# Patient Record
Sex: Female | Born: 1955
Health system: Southern US, Community
[De-identification: ages and names within clinical notes are randomized; demographics above are authoritative.]

## PROBLEM LIST (undated history)

## (undated) DIAGNOSIS — I10 Essential (primary) hypertension: Secondary | ICD-10-CM

---

## 2003-05-06 DIAGNOSIS — I10 Essential (primary) hypertension: Secondary | ICD-10-CM | POA: Insufficient documentation

## 2003-05-06 DIAGNOSIS — E669 Obesity, unspecified: Secondary | ICD-10-CM | POA: Insufficient documentation

## 2006-03-21 DIAGNOSIS — N915 Oligomenorrhea, unspecified: Secondary | ICD-10-CM | POA: Insufficient documentation

## 2006-11-19 ENCOUNTER — Ambulatory Visit: Payer: Self-pay | Admitting: Cardiology

## 2006-12-17 ENCOUNTER — Ambulatory Visit: Payer: Self-pay | Admitting: Cardiology

## 2008-03-16 ENCOUNTER — Ambulatory Visit: Payer: Self-pay | Admitting: Cardiology

## 2008-03-16 DIAGNOSIS — F419 Anxiety disorder, unspecified: Secondary | ICD-10-CM | POA: Insufficient documentation

## 2008-03-16 DIAGNOSIS — I169 Hypertensive crisis, unspecified: Secondary | ICD-10-CM | POA: Insufficient documentation

## 2010-05-23 NOTE — Assessment & Plan Note (Signed)
[facility]                          [facility] OFFICE NOTE   DAN, DISSINGER                       MRN:          [id number]  DATE:12/17/2006                            DOB:          October 05, 1955    HISTORY OF PRESENT ILLNESS:  The patient is a 55 year old female with a  history of obesity and hypertension. The patient was recently admitted  and ruled out for myocardial infarction. The patient underwent a  Cardiolite study, which was low risk. She also had an echo done which  was within normal limits. Ultimately, the patient was felt to be stable  and arrangements were made to followup in our office to see if she would  need any further workup. She did report a single episode of chest pain  for which she takes nitroglycerin. However, she has had no further  complaint since. She states that she is able to her daily activities  without any exertional chest pain or shortness of breath.   MEDICATIONS:  1. Lexapro 10 mg p.o. daily.  2. Hydrochlorothiazide 25 mg p.o. daily.  3. Metoprolol 25 mg p.o. daily.   PHYSICAL EXAMINATION:  VITAL SIGNS: Blood pressure is 144/84, heart rate  76 beats per minute.  NECK: Normal carotid upstroke. No carotid bruits.  LUNGS:  Clear breath sounds bilaterally.  HEART: Regular rate and rhythm.  ABDOMEN: Soft.  EXTREMITIES: No cyanosis, clubbing or edema.  NEURO: The patient is alert, oriented and grossly nonfocal.   PROBLEM LIST:  1. Atypical chest pain.  2. Low risk Cardiolite study.  3. Rule out for myocardial infarction.   PLAN:  1. The patient remains at low risk. She does not smoke and has no      recurrent substernal chest pain. I feel we can continue to watch      her and continue her medical therapy.  2. I have advised the patient that if she has recurrent chest pain, we      can certainly consider cardiac catheterization at that time.     Learta Codding, MD,FACC  Electronically Signed   GED/MedQ  DD: 12/17/2006  DT: 12/17/2006  Job #: [id number]   cc:   Hospitalist Service

## 2010-09-12 DIAGNOSIS — M21611 Bunion of right foot: Secondary | ICD-10-CM | POA: Insufficient documentation

## 2012-07-08 DIAGNOSIS — G8929 Other chronic pain: Secondary | ICD-10-CM | POA: Insufficient documentation

## 2012-07-08 DIAGNOSIS — L84 Corns and callosities: Secondary | ICD-10-CM | POA: Insufficient documentation

## 2015-09-18 ENCOUNTER — Emergency Department (HOSPITAL_COMMUNITY): Payer: Medicare Other

## 2015-09-18 ENCOUNTER — Encounter (HOSPITAL_COMMUNITY): Payer: Self-pay | Admitting: Emergency Medicine

## 2015-09-18 ENCOUNTER — Emergency Department (HOSPITAL_COMMUNITY)
Admission: EM | Admit: 2015-09-18 | Discharge: 2015-09-18 | Disposition: A | Discharge status: Home / Self Care | Payer: Medicare Other | Attending: Emergency Medicine | Admitting: Emergency Medicine

## 2015-09-18 DIAGNOSIS — Z79899 Other long term (current) drug therapy: Secondary | ICD-10-CM | POA: Insufficient documentation

## 2015-09-18 DIAGNOSIS — I10 Essential (primary) hypertension: Secondary | ICD-10-CM | POA: Diagnosis not present

## 2015-09-18 DIAGNOSIS — Z7982 Long term (current) use of aspirin: Secondary | ICD-10-CM | POA: Insufficient documentation

## 2015-09-18 DIAGNOSIS — R0602 Shortness of breath: Secondary | ICD-10-CM | POA: Insufficient documentation

## 2015-09-18 DIAGNOSIS — R079 Chest pain, unspecified: Secondary | ICD-10-CM | POA: Insufficient documentation

## 2015-09-18 DIAGNOSIS — R112 Nausea with vomiting, unspecified: Secondary | ICD-10-CM | POA: Insufficient documentation

## 2015-09-18 HISTORY — DX: Essential (primary) hypertension: I10

## 2015-09-18 LAB — COMPREHENSIVE METABOLIC PANEL
ALT: 14 U/L (ref 14–54)
ANION GAP: 9 (ref 5–15)
AST: 18 U/L (ref 15–41)
Albumin: 4 g/dL (ref 3.5–5.0)
Alkaline Phosphatase: 102 U/L (ref 38–126)
BUN: 14 mg/dL (ref 6–20)
CHLORIDE: 104 mmol/L (ref 101–111)
CO2: 23 mmol/L (ref 22–32)
Calcium: 9.6 mg/dL (ref 8.9–10.3)
Creatinine, Ser: 0.6 mg/dL (ref 0.44–1.00)
GFR calc non Af Amer: >60 mL/min (ref >60)
Glucose, Bld: 104 mg/dL — ABNORMAL HIGH (ref 65–99)
Potassium: 3.5 mmol/L (ref 3.5–5.1)
SODIUM: 136 mmol/L (ref 135–145)
Total Bilirubin: 1.3 mg/dL — ABNORMAL HIGH (ref 0.3–1.2)
Total Protein: 7.4 g/dL (ref 6.5–8.1)

## 2015-09-18 LAB — CBC WITH DIFFERENTIAL/PLATELET
Basophils Absolute: 0 10*3/uL (ref 0.0–0.1)
Basophils Relative: 0 %
EOS ABS: 0.3 10*3/uL (ref 0.0–0.7)
EOS PCT: 4 %
HCT: 40.2 % (ref 36.0–46.0)
Hemoglobin: 13.3 g/dL (ref 12.0–15.0)
LYMPHS ABS: 1.6 10*3/uL (ref 0.7–4.0)
Lymphocytes Relative: 23 %
MCH: 29.1 pg (ref 26.0–34.0)
MCHC: 33.1 g/dL (ref 30.0–36.0)
MCV: 88 fL (ref 78.0–100.0)
MONOS PCT: 8 %
Monocytes Absolute: 0.6 10*3/uL (ref 0.1–1.0)
Neutro Abs: 4.5 10*3/uL (ref 1.7–7.7)
Neutrophils Relative %: 65 %
PLATELETS: 201 10*3/uL (ref 150–400)
RBC: 4.57 MIL/uL (ref 3.87–5.11)
RDW: 12.9 % (ref 11.5–15.5)
WBC: 7 10*3/uL (ref 4.0–10.5)

## 2015-09-18 LAB — I-STAT TROPONIN, ED: Troponin i, poc: 0 ng/mL (ref 0.00–0.08)

## 2015-09-18 LAB — D-DIMER, QUANTITATIVE (NOT AT ARMC): D DIMER QUANT: 0.49 ug{FEU}/mL (ref 0.00–0.50)

## 2015-09-18 LAB — LIPASE, BLOOD: Lipase: 19 U/L (ref 11–51)

## 2015-09-18 MED ORDER — SODIUM CHLORIDE 0.9 % IV SOLN
INTRAVENOUS | Status: DC
  Administered 2015-09-18: 100 mL/h via INTRAVENOUS

## 2015-09-18 MED ORDER — ASPIRIN 81 MG PO CHEW: 324.0000 mg | CHEWABLE_TABLET | Freq: Once | ORAL | Status: DC

## 2015-09-18 MED ORDER — ONDANSETRON HCL 4 MG/2ML IJ SOLN
4.0000 mg | Freq: Once | INTRAMUSCULAR | Status: AC
  Administered 2015-09-18: 4 mg via INTRAVENOUS
  Filled 2015-09-18: qty 2

## 2015-09-18 NOTE — ED Notes (Addendum)
Pt's POA  Roylene Reason   [phone]

## 2015-09-18 NOTE — ED Triage Notes (Addendum)
Pt reports chest pain beginning a few days ago, intermittent in nature.  States she has been very worried about her mother as her mother takes care of her and was recently in the hospital.  States these episodes are associated with feeling like she cannot get a deep breath and resolve "not long after they start."  Has also been crying a lot.

## 2015-09-18 NOTE — ED Notes (Signed)
Patient verbalizes understanding of discharge instructions, home care and follow up care. Patient out of department at this time. 

## 2015-09-18 NOTE — ED Provider Notes (Signed)
[facility] DEPT Provider Note   CSN: [id number] Arrival date & time: 09/18/15  1522     History   Chief Complaint Chief Complaint  Patient presents with  . Chest Pain    HPI Crystal Kelly is a 60 y.o. female.  Patient with onset of left-sided chest pain at 2 in the morning yesterday. Pain has been constant. Radiates occasionally to the left arm. Associated with shortness of breath and 2 episodes of vomiting yesterday. No vomiting today. No prior history of similar chest pain. Pain at its worse was described as 8 out of 10. Currently it's improving in its about 2 out of 10.      Past Medical History:  Diagnosis Date  . Hypertension     There are no active problems to display for this patient.   History reviewed. No pertinent surgical history.  OB History    No data available       Home Medications    Prior to Admission medications   Medication Sig Start Date End Date Taking? Authorizing Provider  acetaminophen (TYLENOL) 500 MG tablet Take 1,000 mg by mouth every 6 (six) hours as needed for mild pain or headache.   Yes Historical Provider, MD  amLODipine (NORVASC) 5 MG tablet Take 5 mg by mouth daily.   Yes Historical Provider, MD  aspirin EC 81 MG tablet Take 81 mg by mouth daily.   Yes Historical Provider, MD  Multiple Vitamins-Minerals (EQ VISION FORMULA 50+ PO) Take 1 tablet by mouth daily.   Yes Historical Provider, MD    Family History History reviewed. No pertinent family history.  Social History Social History  Substance Use Topics  . Smoking status: Never Smoker  . Smokeless tobacco: Not on file  . Alcohol use No     Allergies   Penicillins   Review of Systems Review of Systems  Constitutional: Negative for fever.  HENT: Positive for congestion.   Eyes: Negative for visual disturbance.  Respiratory: Positive for shortness of breath.   Cardiovascular: Positive for chest pain. Negative for leg swelling.  Gastrointestinal: Positive for  nausea and vomiting. Negative for abdominal pain.  Genitourinary: Negative for dysuria.  Musculoskeletal: Negative for back pain.  Skin: Negative for rash.  Neurological: Negative for headaches.  Hematological: Does not bruise/bleed easily.  Psychiatric/Behavioral: Negative for confusion.     Physical Exam Updated Vital Signs BP 121/59   Pulse 71   Temp 98 F (36.7 C) (Oral)   Resp 21   Ht 5\' 6"  (1.676 m)   Wt 104.3 kg   SpO2 96%   BMI 37.12 kg/m   Physical Exam  Constitutional: She is oriented to person, place, and time. She appears well-developed and well-nourished. No distress.  HENT:  Head: Normocephalic and atraumatic.  Eyes: EOM are normal. Pupils are equal, round, and reactive to light.  Neck: Normal range of motion. Neck supple.  Cardiovascular: Normal rate, regular rhythm and normal heart sounds.   Pulmonary/Chest: Effort normal and breath sounds normal. No respiratory distress.  Abdominal: Soft. Bowel sounds are normal. There is no tenderness.  Musculoskeletal: Normal range of motion. She exhibits no edema.  Neurological: She is alert and oriented to person, place, and time. No cranial nerve deficit. She exhibits normal muscle tone. Coordination normal.  Skin: Skin is warm.  Nursing note and vitals reviewed.    ED Treatments / Results  Labs (all labs ordered are listed, but only abnormal results are displayed) Labs Reviewed  COMPREHENSIVE METABOLIC PANEL -  Abnormal; Notable for the following:       Result Value   Glucose, Bld 104 (*)    Total Bilirubin 1.3 (*)    All other components within normal limits  CBC WITH DIFFERENTIAL/PLATELET  LIPASE, BLOOD  D-DIMER, QUANTITATIVE (NOT AT [facility])  I-STAT TROPOININ, ED    EKG  EKG Interpretation  Date/Time:  Sunday September 18 2015 15:25:31 EDT Ventricular Rate:  82 PR Interval:    QRS Duration: 109 QT Interval:  388 QTC Calculation: 454 R Axis:   -26 Text Interpretation:  Sinus rhythm Borderline  prolonged PR interval Left ventricular hypertrophy No previous ECGs available Confirmed by Joley Utecht  MD, Laiza Veenstra [phone]) on 09/18/2015 3:33:12 PM       Radiology Dg Chest 2 View  Result Date: 09/18/2015 CLINICAL DATA:  Chest pain EXAM: CHEST  2 VIEW COMPARISON:  None. FINDINGS: Cardiomediastinal silhouette is unremarkable. No acute infiltrate or pleural effusion. No pulmonary edema. Osteopenia and mild degenerative changes thoracic spine. IMPRESSION: No active cardiopulmonary disease. Electronically Signed   By: Lahoma Crocker M.D.   On: 09/18/2015 17:06    Procedures Procedures (including critical care time)  Medications Ordered in ED Medications  0.9 %  sodium chloride infusion (100 mL/hr Intravenous New Bag/Given 09/18/15 1736)  aspirin chewable tablet 324 mg (324 mg Oral Not Given 09/18/15 1731)  ondansetron (ZOFRAN) injection 4 mg (4 mg Intravenous Given 09/18/15 1736)     Initial Impression / Assessment and Plan / ED Course  I have reviewed the triage vital signs and the nursing notes.  Pertinent labs & imaging results that were available during my care of the patient were reviewed by me and considered in my medical decision making (see chart for details).  Clinical Course    Patient with a complaint of chest pain that started on Saturday afternoon yesterday at 2 in the morning. Spin constant left-sided chest pain involving left arm. Associated with a couple episodes of vomiting and shortness of breath. Pain is been improving today not getting worse but has not resolved. After stay here in the emergency department pain completely resolved. Workup without any acute findings. Troponin was negative d-dimer was negative chest x-ray without evidence of pneumonia pneumothorax or pulmonary edema. Patient stable for discharge home and follow-up with cardiology. Patient was started on baby aspirin a day.  Final Clinical Impressions(s) / ED Diagnoses   Final diagnoses:  Chest pain, unspecified  chest pain type    New Prescriptions New Prescriptions   No medications on file     [name], MD 09/18/15 1908

## 2015-09-18 NOTE — ED Provider Notes (Signed)
[facility] DEPT Provider Note   CSN: [id number] Arrival date & time: 09/18/15  1522     History   Chief Complaint Chief Complaint  Patient presents with  . Chest Pain    HPI Crystal Kelly is a 60 y.o. female.  HPI  Past Medical History:  Diagnosis Date  . Hypertension     There are no active problems to display for this patient.   History reviewed. No pertinent surgical history.  OB History    No data available       Home Medications    Prior to Admission medications   Medication Sig Start Date End Date Taking? Authorizing Provider  acetaminophen (TYLENOL) 500 MG tablet Take 1,000 mg by mouth every 6 (six) hours as needed for mild pain or headache.   Yes Historical Provider, MD  amLODipine (NORVASC) 5 MG tablet Take 5 mg by mouth daily.   Yes Historical Provider, MD  aspirin EC 81 MG tablet Take 81 mg by mouth daily.   Yes Historical Provider, MD  Multiple Vitamins-Minerals (EQ VISION FORMULA 50+ PO) Take 1 tablet by mouth daily.   Yes Historical Provider, MD    Family History History reviewed. No pertinent family history.  Social History Social History  Substance Use Topics  . Smoking status: Never Smoker  . Smokeless tobacco: Not on file  . Alcohol use No     Allergies   Penicillins   Review of Systems Review of Systems   Physical Exam Updated Vital Signs BP 121/59   Pulse 71   Temp 98 F (36.7 C) (Oral)   Resp 21   Ht 5\' 6"  (1.676 m)   Wt 104.3 kg   SpO2 96%   BMI 37.12 kg/m   Physical Exam   ED Treatments / Results  Labs (all labs ordered are listed, but only abnormal results are displayed) Labs Reviewed  COMPREHENSIVE METABOLIC PANEL - Abnormal; Notable for the following:       Result Value   Glucose, Bld 104 (*)    Total Bilirubin 1.3 (*)    All other components within normal limits  CBC WITH DIFFERENTIAL/PLATELET  LIPASE, BLOOD  D-DIMER, QUANTITATIVE (NOT AT [facility])  I-STAT TROPOININ, ED    EKG  EKG  Interpretation  Date/Time:  Sunday September 18 2015 15:25:31 EDT Ventricular Rate:  82 PR Interval:    QRS Duration: 109 QT Interval:  388 QTC Calculation: 454 R Axis:   -26 Text Interpretation:  Sinus rhythm Borderline prolonged PR interval Left ventricular hypertrophy No previous ECGs available Confirmed by Euna Armon  MD, Aysel Gilchrest ([id number]) on 09/18/2015 3:33:12 PM       Radiology Dg Chest 2 View  Result Date: 09/18/2015 CLINICAL DATA:  Chest pain EXAM: CHEST  2 VIEW COMPARISON:  None. FINDINGS: Cardiomediastinal silhouette is unremarkable. No acute infiltrate or pleural effusion. No pulmonary edema. Osteopenia and mild degenerative changes thoracic spine. IMPRESSION: No active cardiopulmonary disease. Electronically Signed   By: Lahoma Crocker M.D.   On: 09/18/2015 17:06    Procedures Procedures (including critical care time)  Medications Ordered in ED Medications  0.9 %  sodium chloride infusion (100 mL/hr Intravenous New Bag/Given 09/18/15 1736)  aspirin chewable tablet 324 mg (324 mg Oral Not Given 09/18/15 1731)  ondansetron (ZOFRAN) injection 4 mg (4 mg Intravenous Given 09/18/15 1736)     Initial Impression / Assessment and Plan / ED Course  I have reviewed the triage vital signs and the nursing notes.  Pertinent labs & imaging  results that were available during my care of the patient were reviewed by me and considered in my medical decision making (see chart for details).  Clinical Course      Final Clinical Impressions(s) / ED Diagnoses   Final diagnoses:  Chest pain, unspecified chest pain type    New Prescriptions New Prescriptions   No medications on file     [name], MD 09/18/15 1905

## 2015-09-18 NOTE — Discharge Instructions (Signed)
Make appointment to follow-up with your doctor or cardiology. Recommend taking a baby aspirin a day. Return for any new or worse symptoms.

## 2016-02-23 DIAGNOSIS — M25572 Pain in left ankle and joints of left foot: Secondary | ICD-10-CM | POA: Diagnosis not present

## 2016-02-23 DIAGNOSIS — M25571 Pain in right ankle and joints of right foot: Secondary | ICD-10-CM | POA: Diagnosis not present

## 2016-02-23 DIAGNOSIS — M79672 Pain in left foot: Secondary | ICD-10-CM | POA: Diagnosis not present

## 2016-02-23 DIAGNOSIS — M79671 Pain in right foot: Secondary | ICD-10-CM | POA: Diagnosis not present

## 2016-03-08 DIAGNOSIS — M2011 Hallux valgus (acquired), right foot: Secondary | ICD-10-CM | POA: Diagnosis not present

## 2016-03-08 DIAGNOSIS — Z01812 Encounter for preprocedural laboratory examination: Secondary | ICD-10-CM | POA: Diagnosis not present

## 2016-03-08 DIAGNOSIS — Z79899 Other long term (current) drug therapy: Secondary | ICD-10-CM | POA: Diagnosis not present

## 2016-03-08 DIAGNOSIS — M25571 Pain in right ankle and joints of right foot: Secondary | ICD-10-CM | POA: Diagnosis not present

## 2016-03-20 DIAGNOSIS — M2011 Hallux valgus (acquired), right foot: Secondary | ICD-10-CM | POA: Diagnosis not present

## 2016-03-20 DIAGNOSIS — M2021 Hallux rigidus, right foot: Secondary | ICD-10-CM | POA: Diagnosis not present

## 2016-03-20 DIAGNOSIS — M25571 Pain in right ankle and joints of right foot: Secondary | ICD-10-CM | POA: Diagnosis not present

## 2016-03-20 DIAGNOSIS — M7741 Metatarsalgia, right foot: Secondary | ICD-10-CM | POA: Diagnosis not present

## 2016-03-23 DIAGNOSIS — M25571 Pain in right ankle and joints of right foot: Secondary | ICD-10-CM | POA: Diagnosis not present

## 2016-04-04 DIAGNOSIS — M25571 Pain in right ankle and joints of right foot: Secondary | ICD-10-CM | POA: Diagnosis not present

## 2016-04-19 DIAGNOSIS — M25571 Pain in right ankle and joints of right foot: Secondary | ICD-10-CM | POA: Diagnosis not present

## 2016-05-04 DIAGNOSIS — M25571 Pain in right ankle and joints of right foot: Secondary | ICD-10-CM | POA: Diagnosis not present

## 2016-06-01 DIAGNOSIS — M25571 Pain in right ankle and joints of right foot: Secondary | ICD-10-CM | POA: Diagnosis not present

## 2016-08-07 DIAGNOSIS — Z789 Other specified health status: Secondary | ICD-10-CM | POA: Diagnosis not present

## 2016-08-07 DIAGNOSIS — M21969 Unspecified acquired deformity of unspecified lower leg: Secondary | ICD-10-CM | POA: Diagnosis not present

## 2016-08-07 DIAGNOSIS — Z299 Encounter for prophylactic measures, unspecified: Secondary | ICD-10-CM | POA: Diagnosis not present

## 2016-08-07 DIAGNOSIS — I10 Essential (primary) hypertension: Secondary | ICD-10-CM | POA: Diagnosis not present

## 2016-08-07 DIAGNOSIS — Z6841 Body Mass Index (BMI) 40.0 and over, adult: Secondary | ICD-10-CM | POA: Diagnosis not present

## 2016-08-07 DIAGNOSIS — M170 Bilateral primary osteoarthritis of knee: Secondary | ICD-10-CM | POA: Diagnosis not present

## 2016-08-20 ENCOUNTER — Other Ambulatory Visit: Payer: Self-pay | Admitting: Otolaryngology

## 2017-06-25 DIAGNOSIS — Z713 Dietary counseling and surveillance: Secondary | ICD-10-CM | POA: Diagnosis not present

## 2017-06-25 DIAGNOSIS — I10 Essential (primary) hypertension: Secondary | ICD-10-CM | POA: Diagnosis not present

## 2017-06-25 DIAGNOSIS — Z6841 Body Mass Index (BMI) 40.0 and over, adult: Secondary | ICD-10-CM | POA: Diagnosis not present

## 2017-06-25 DIAGNOSIS — Z299 Encounter for prophylactic measures, unspecified: Secondary | ICD-10-CM | POA: Diagnosis not present

## 2018-03-19 DIAGNOSIS — Z1331 Encounter for screening for depression: Secondary | ICD-10-CM | POA: Diagnosis not present

## 2018-03-19 DIAGNOSIS — I10 Essential (primary) hypertension: Secondary | ICD-10-CM | POA: Diagnosis not present

## 2018-03-19 DIAGNOSIS — Z2821 Immunization not carried out because of patient refusal: Secondary | ICD-10-CM | POA: Diagnosis not present

## 2018-03-19 DIAGNOSIS — Z7189 Other specified counseling: Secondary | ICD-10-CM | POA: Diagnosis not present

## 2018-03-19 DIAGNOSIS — Z1211 Encounter for screening for malignant neoplasm of colon: Secondary | ICD-10-CM | POA: Diagnosis not present

## 2018-03-19 DIAGNOSIS — Z6841 Body Mass Index (BMI) 40.0 and over, adult: Secondary | ICD-10-CM | POA: Diagnosis not present

## 2018-03-19 DIAGNOSIS — Z1339 Encounter for screening examination for other mental health and behavioral disorders: Secondary | ICD-10-CM | POA: Diagnosis not present

## 2018-03-19 DIAGNOSIS — Z0000 Encounter for general adult medical examination without abnormal findings: Secondary | ICD-10-CM | POA: Diagnosis not present

## 2018-03-19 DIAGNOSIS — Z299 Encounter for prophylactic measures, unspecified: Secondary | ICD-10-CM | POA: Diagnosis not present

## 2018-03-25 DIAGNOSIS — Z79899 Other long term (current) drug therapy: Secondary | ICD-10-CM | POA: Diagnosis not present

## 2018-03-25 DIAGNOSIS — I10 Essential (primary) hypertension: Secondary | ICD-10-CM | POA: Diagnosis not present

## 2018-06-25 DIAGNOSIS — D485 Neoplasm of uncertain behavior of skin: Secondary | ICD-10-CM | POA: Diagnosis not present

## 2018-06-25 DIAGNOSIS — I10 Essential (primary) hypertension: Secondary | ICD-10-CM | POA: Diagnosis not present

## 2018-06-25 DIAGNOSIS — D3610 Benign neoplasm of peripheral nerves and autonomic nervous system, unspecified: Secondary | ICD-10-CM | POA: Diagnosis not present

## 2018-06-25 DIAGNOSIS — Z6841 Body Mass Index (BMI) 40.0 and over, adult: Secondary | ICD-10-CM | POA: Diagnosis not present

## 2018-06-25 DIAGNOSIS — L720 Epidermal cyst: Secondary | ICD-10-CM | POA: Diagnosis not present

## 2018-06-25 DIAGNOSIS — D3612 Benign neoplasm of peripheral nerves and autonomic nervous system, upper limb, including shoulder: Secondary | ICD-10-CM | POA: Diagnosis not present

## 2018-06-25 DIAGNOSIS — Z299 Encounter for prophylactic measures, unspecified: Secondary | ICD-10-CM | POA: Diagnosis not present

## 2018-06-25 DIAGNOSIS — M79671 Pain in right foot: Secondary | ICD-10-CM | POA: Diagnosis not present

## 2018-07-07 DIAGNOSIS — H541223 Low vision right eye category 2, blindness left eye category 3: Secondary | ICD-10-CM | POA: Diagnosis not present

## 2018-07-07 DIAGNOSIS — Z961 Presence of intraocular lens: Secondary | ICD-10-CM | POA: Diagnosis not present

## 2018-07-07 DIAGNOSIS — H3581 Retinal edema: Secondary | ICD-10-CM | POA: Diagnosis not present

## 2018-07-09 DIAGNOSIS — Z299 Encounter for prophylactic measures, unspecified: Secondary | ICD-10-CM | POA: Diagnosis not present

## 2018-07-09 DIAGNOSIS — Z6841 Body Mass Index (BMI) 40.0 and over, adult: Secondary | ICD-10-CM | POA: Diagnosis not present

## 2018-07-09 DIAGNOSIS — M170 Bilateral primary osteoarthritis of knee: Secondary | ICD-10-CM | POA: Diagnosis not present

## 2018-07-09 DIAGNOSIS — I10 Essential (primary) hypertension: Secondary | ICD-10-CM | POA: Diagnosis not present

## 2018-07-09 DIAGNOSIS — L720 Epidermal cyst: Secondary | ICD-10-CM | POA: Diagnosis not present

## 2018-07-23 DIAGNOSIS — G8929 Other chronic pain: Secondary | ICD-10-CM | POA: Diagnosis not present

## 2018-07-23 DIAGNOSIS — M25569 Pain in unspecified knee: Secondary | ICD-10-CM | POA: Diagnosis not present

## 2018-07-23 DIAGNOSIS — G894 Chronic pain syndrome: Secondary | ICD-10-CM | POA: Diagnosis not present

## 2018-07-23 DIAGNOSIS — M79673 Pain in unspecified foot: Secondary | ICD-10-CM | POA: Diagnosis not present

## 2018-07-23 DIAGNOSIS — M79606 Pain in leg, unspecified: Secondary | ICD-10-CM | POA: Diagnosis not present

## 2018-08-13 ENCOUNTER — Other Ambulatory Visit (HOSPITAL_COMMUNITY): Payer: Self-pay | Admitting: Neurology

## 2018-08-13 ENCOUNTER — Ambulatory Visit (HOSPITAL_COMMUNITY)
Admission: RE | Admit: 2018-08-13 | Discharge: 2018-08-13 | Disposition: A | Discharge status: Home / Self Care | Payer: Medicare HMO | Source: Ambulatory Visit | Attending: Neurology | Admitting: Neurology

## 2018-08-13 ENCOUNTER — Other Ambulatory Visit: Payer: Self-pay

## 2018-08-13 DIAGNOSIS — E559 Vitamin D deficiency, unspecified: Secondary | ICD-10-CM | POA: Diagnosis not present

## 2018-08-13 DIAGNOSIS — M25562 Pain in left knee: Secondary | ICD-10-CM

## 2018-08-13 DIAGNOSIS — E039 Hypothyroidism, unspecified: Secondary | ICD-10-CM | POA: Diagnosis not present

## 2018-08-13 DIAGNOSIS — M1712 Unilateral primary osteoarthritis, left knee: Secondary | ICD-10-CM | POA: Diagnosis not present

## 2018-08-13 DIAGNOSIS — R7303 Prediabetes: Secondary | ICD-10-CM | POA: Diagnosis not present

## 2018-08-13 DIAGNOSIS — M25561 Pain in right knee: Secondary | ICD-10-CM

## 2018-08-13 DIAGNOSIS — M1711 Unilateral primary osteoarthritis, right knee: Secondary | ICD-10-CM | POA: Diagnosis not present

## 2018-08-13 DIAGNOSIS — L959 Vasculitis limited to the skin, unspecified: Secondary | ICD-10-CM | POA: Diagnosis not present

## 2018-08-13 DIAGNOSIS — D519 Vitamin B12 deficiency anemia, unspecified: Secondary | ICD-10-CM | POA: Diagnosis not present

## 2018-08-20 DIAGNOSIS — M25569 Pain in unspecified knee: Secondary | ICD-10-CM | POA: Diagnosis not present

## 2018-08-20 DIAGNOSIS — M79606 Pain in leg, unspecified: Secondary | ICD-10-CM | POA: Diagnosis not present

## 2018-08-20 DIAGNOSIS — M79673 Pain in unspecified foot: Secondary | ICD-10-CM | POA: Diagnosis not present

## 2018-08-20 DIAGNOSIS — G8929 Other chronic pain: Secondary | ICD-10-CM | POA: Diagnosis not present

## 2018-09-10 ENCOUNTER — Ambulatory Visit (INDEPENDENT_AMBULATORY_CARE_PROVIDER_SITE_OTHER): Payer: Medicare HMO | Admitting: Orthopedic Surgery

## 2018-09-10 ENCOUNTER — Ambulatory Visit: Payer: Medicare HMO

## 2018-09-10 ENCOUNTER — Other Ambulatory Visit: Payer: Self-pay

## 2018-09-10 VITALS — Ht 61.0 in | Wt 230.0 lb

## 2018-09-10 DIAGNOSIS — M5440 Lumbago with sciatica, unspecified side: Secondary | ICD-10-CM

## 2018-09-10 MED ORDER — GABAPENTIN 100 MG PO CAPS: 100.0000 mg | ORAL_CAPSULE | Freq: Three times a day (TID) | ORAL | 2 refills | Status: AC

## 2018-09-10 MED ORDER — PREDNISONE 10 MG PO TABS: 10.0000 mg | ORAL_TABLET | Freq: Three times a day (TID) | ORAL | 0 refills | Status: AC

## 2018-09-10 NOTE — Progress Notes (Signed)
Crystal Kelly  09/10/2018  HISTORY SECTION :  Chief Complaint  Patient presents with  . Knee Pain    Bilateral knee pain, referred by Dr. Merlene Laughter   HPI The patient presents for evaluation of bilateral knee pain but she says her left leg hurts.  She is having some left knee pain but seems to me like she is complaining of giving out of her left knee associated with back pain leg pain and some knee pain.  Her symptoms reveals severe pain with night pain in the leg not necessarily all the.  It is a dull aching gnawing sensation no improvement with Ultram and Cymbalta Pain has been present for a long time making it chronic.  She does use a cane. Review of Systems  All other systems reviewed and are negative.    Past Medical History:  Diagnosis Date  . Hypertension     No past surgical history on file.   Allergies  Allergen Reactions  . Penicillins Shortness Of Breath, Swelling and Rash    Has patient had a PCN reaction causing immediate rash, facial/tongue/throat swelling, SOB or lightheadedness with hypotension: Yes Has patient had a PCN reaction causing severe rash involving mucus membranes or skin necrosis: Yes Has patient had a PCN reaction that required hospitalization No Has patient had a PCN reaction occurring within the last 10 years: Yes If all of the above answers are "NO", then may proceed with Cephalosporin use.      Current Outpatient Medications:  .  acetaminophen (TYLENOL) 500 MG tablet, Take 1,000 mg by mouth every 6 (six) hours as needed for mild pain or headache., Disp: , Rfl:  .  aspirin EC 81 MG tablet, Take 81 mg by mouth daily., Disp: , Rfl:  .  DULoxetine (CYMBALTA) 60 MG capsule, Take 60 mg by mouth daily., Disp: , Rfl:  .  lisinopril-hydrochlorothiazide (ZESTORETIC) 10-12.5 MG tablet, Take 1 tablet by mouth daily., Disp: , Rfl:  .  Multiple Vitamins-Minerals (EQ VISION FORMULA 50+ PO), Take 1 tablet by mouth daily., Disp: , Rfl:  .  traMADol (ULTRAM)  50 MG tablet, Take by mouth every 6 (six) hours as needed., Disp: , Rfl:  .  amLODipine (NORVASC) 5 MG tablet, Take 5 mg by mouth daily., Disp: , Rfl:    PHYSICAL EXAM SECTION: 1) Ht 5\' 1"  (1.549 m)   Wt 230 lb (104.3 kg)   BMI 43.46 kg/m   Body mass index is 43.46 kg/m. General appearance: Well-developed well-nourished no gross deformities  2) Cardiovascular normal pulse and perfusion in the lower  extremities normal color without edema  3) Neurologically deep tendon reflexes are equal and normal, no sensation loss or deficits no pathologic reflexes  4) Psychological: Awake alert and oriented x3 mood and affect normal  5) Skin no lacerations or ulcerations no nodularity no palpable masses, no erythema or nodularity  6) Musculoskeletal:   She says her back does not hurt when I palpated  She does have normal right straight leg raise positive left straight leg raise she does have some tenderness in her knee she has pain when I bend and straighten on the left side normal on the right  I do not detect meniscal pathology  No weakness in the lower extremities  No reflex deficits  No sensory changes  MEDICAL DECISION SECTION:  Encounter Diagnosis  Name Primary?  . Low back pain with sciatica, sciatica laterality unspecified, unspecified back pain laterality, unspecified chronicity Yes    Imaging  Images are noted from the hospital AP and lateral right and left knee both knees show chronic symmetric osteoarthritis with some osteopenia there appears to be patellofemoral disease with osteophyte formation there and some posterior bone osteophytes  Internal imaging lumbar spine same L4-5 anterior osteophytes to space narrowing L4-5 and L5-S1 disc space narrowing loss of lumbar lordosis facet arthrosis truncal asymmetry shifted to the right  See report   Plan:  (Rx., Inj., surg., Frx, MRI/CT, XR:2)  Continue tramadol and Cymbalta  Add 10 mg prednisone 3 times a day  100 mg  gabapentin 3 times a day  Send to physical therapy 2:23 PM Arther Abbott, MD  09/10/2018

## 2018-09-10 NOTE — Addendum Note (Signed)
Addended byCandice Camp on: 09/10/2018 02:44 PM   Modules accepted: Orders

## 2018-09-10 NOTE — Patient Instructions (Addendum)
You have a pinched nerve in your back.  Your insurance requires physical therapy.  I started on 2 new medications please try them for pain  Please complete the physical therapy and come back for reassessment   Radicular Pain Radicular pain is a type of pain that spreads from your back or neck along a spinal nerve. Spinal nerves are nerves that leave the spinal cord and go to the muscles. Radicular pain is sometimes called radiculopathy, radiculitis, or a pinched nerve. When you have this type of pain, you may also have weakness, numbness, or tingling in the area of your body that is supplied by the nerve. The pain may feel sharp and burning. Depending on which spinal nerve is affected, the pain may occur in the:  Neck area (cervical radicular pain). You may also feel pain, numbness, weakness, or tingling in the arms.  Mid-spine area (thoracic radicular pain). You would feel this pain in the back and chest. This type is rare.  Lower back area (lumbar radicular pain). You would feel this pain as low back pain. You may feel pain, numbness, weakness, or tingling in the buttocks or legs. Sciatica is a type of lumbar radicular pain that shoots down the back of the leg. Radicular pain occurs when one of the spinal nerves becomes irritated or squeezed (compressed). It is often caused by something pushing on a spinal nerve, such as one of the bones of the spine (vertebrae) or one of the round cushions between vertebrae (intervertebral disks). This can result from:  An injury.  Wear and tear or aging of a disk.  The growth of a bone spur that pushes on the nerve. Radicular pain often goes away when you follow instructions from your health care provider for relieving pain at home. Follow these instructions at home: Managing pain      If directed, put ice on the affected area: ? Put ice in a plastic bag. ? Place a towel between your skin and the bag. ? Leave the ice on for 20 minutes, 2-3 times a  day.  If directed, apply heat to the affected area as often as told by your health care provider. Use the heat source that your health care provider recommends, such as a moist heat pack or a heating pad. ? Place a towel between your skin and the heat source. ? Leave the heat on for 20-30 minutes. ? Remove the heat if your skin turns bright red. This is especially important if you are unable to feel pain, heat, or cold. You may have a greater risk of getting burned. Activity   Do not sit or rest in bed for long periods of time.  Try to stay as active as possible. Ask your health care provider what type of exercise or activity is best for you.  Avoid activities that make your pain worse, such as bending and lifting.  Do not lift anything that is heavier than 10 lb (4.5 kg), or the limit that you are told, until your health care provider says that it is safe.  Practice using proper technique when lifting items. Proper lifting technique involves bending your knees and rising up.  Do strength and range-of-motion exercises only as told by your health care provider or physical therapist. General instructions  Take over-the-counter and prescription medicines only as told by your health care provider.  Pay attention to any changes in your symptoms.  Keep all follow-up visits as told by your health care provider. This  is important. ? Your health care provider may send you to a physical therapist to help with this pain. Contact a health care provider if:  Your pain and other symptoms get worse.  Your pain medicine is not helping.  Your pain has not improved after a few weeks of home care.  You have a fever. Get help right away if:  You have severe pain, weakness, or numbness.  You have difficulty with bladder or bowel control. Summary  Radicular pain is a type of pain that spreads from your back or neck along a spinal nerve.  When you have radicular pain, you may also have  weakness, numbness, or tingling in the area of your body that is supplied by the nerve.  The pain may feel sharp or burning.  Radicular pain may be treated with ice, heat, medicines, or physical therapy. This information is not intended to replace advice given to you by your health care provider. Make sure you discuss any questions you have with your health care provider. Document Released: 02/02/2004 Document Revised: 07/09/2017 Document Reviewed: 07/09/2017 Elsevier Patient Education  [place].  Radicular Pain Radicular pain is a type of pain that spreads from your back or neck along a spinal nerve. Spinal nerves are nerves that leave the spinal cord and go to the muscles. Radicular pain is sometimes called radiculopathy, radiculitis, or a pinched nerve. When you have this type of pain, you may also have weakness, numbness, or tingling in the area of your body that is supplied by the nerve. The pain may feel sharp and burning. Depending on which spinal nerve is affected, the pain may occur in the:  Neck area (cervical radicular pain). You may also feel pain, numbness, weakness, or tingling in the arms.  Mid-spine area (thoracic radicular pain). You would feel this pain in the back and chest. This type is rare.  Lower back area (lumbar radicular pain). You would feel this pain as low back pain. You may feel pain, numbness, weakness, or tingling in the buttocks or legs. Sciatica is a type of lumbar radicular pain that shoots down the back of the leg. Radicular pain occurs when one of the spinal nerves becomes irritated or squeezed (compressed). It is often caused by something pushing on a spinal nerve, such as one of the bones of the spine (vertebrae) or one of the round cushions between vertebrae (intervertebral disks). This can result from:  An injury.  Wear and tear or aging of a disk.  The growth of a bone spur that pushes on the nerve. Radicular pain often goes away when you  follow instructions from your health care provider for relieving pain at home. Follow these instructions at home: Managing pain      If directed, put ice on the affected area: ? Put ice in a plastic bag. ? Place a towel between your skin and the bag. ? Leave the ice on for 20 minutes, 2-3 times a day.  If directed, apply heat to the affected area as often as told by your health care provider. Use the heat source that your health care provider recommends, such as a moist heat pack or a heating pad. ? Place a towel between your skin and the heat source. ? Leave the heat on for 20-30 minutes. ? Remove the heat if your skin turns bright red. This is especially important if you are unable to feel pain, heat, or cold. You may have a greater risk of getting  burned. Activity   Do not sit or rest in bed for long periods of time.  Try to stay as active as possible. Ask your health care provider what type of exercise or activity is best for you.  Avoid activities that make your pain worse, such as bending and lifting.  Do not lift anything that is heavier than 10 lb (4.5 kg), or the limit that you are told, until your health care provider says that it is safe.  Practice using proper technique when lifting items. Proper lifting technique involves bending your knees and rising up.  Do strength and range-of-motion exercises only as told by your health care provider or physical therapist. General instructions  Take over-the-counter and prescription medicines only as told by your health care provider.  Pay attention to any changes in your symptoms.  Keep all follow-up visits as told by your health care provider. This is important. ? Your health care provider may send you to a physical therapist to help with this pain. Contact a health care provider if:  Your pain and other symptoms get worse.  Your pain medicine is not helping.  Your pain has not improved after a few weeks of home  care.  You have a fever. Get help right away if:  You have severe pain, weakness, or numbness.  You have difficulty with bladder or bowel control. Summary  Radicular pain is a type of pain that spreads from your back or neck along a spinal nerve.  When you have radicular pain, you may also have weakness, numbness, or tingling in the area of your body that is supplied by the nerve.  The pain may feel sharp or burning.  Radicular pain may be treated with ice, heat, medicines, or physical therapy. This information is not intended to replace advice given to you by your health care provider. Make sure you discuss any questions you have with your health care provider. Document Released: 02/02/2004 Document Revised: 07/09/2017 Document Reviewed: 07/09/2017 Elsevier Patient Education  2020 [employer].

## 2018-09-16 ENCOUNTER — Ambulatory Visit (HOSPITAL_COMMUNITY): Payer: Medicare HMO | Attending: Physical Therapy | Admitting: Physical Therapy

## 2018-09-16 ENCOUNTER — Telehealth (HOSPITAL_COMMUNITY): Payer: Self-pay | Admitting: Physical Therapy

## 2018-09-16 NOTE — Telephone Encounter (Signed)
Called pt to r/s s/w Crystal Kelly her daughter -she said they could not afford the 40.00 copay and that they felt that PT would not do any good. They requested to close the referral.NF 09/16/2018.

## 2018-10-01 DIAGNOSIS — M79606 Pain in leg, unspecified: Secondary | ICD-10-CM | POA: Diagnosis not present

## 2018-10-01 DIAGNOSIS — G8929 Other chronic pain: Secondary | ICD-10-CM | POA: Diagnosis not present

## 2018-10-01 DIAGNOSIS — M79673 Pain in unspecified foot: Secondary | ICD-10-CM | POA: Diagnosis not present

## 2018-10-01 DIAGNOSIS — M25569 Pain in unspecified knee: Secondary | ICD-10-CM | POA: Diagnosis not present

## 2018-10-29 ENCOUNTER — Ambulatory Visit: Payer: Medicare HMO | Admitting: Orthopedic Surgery

## 2018-11-24 ENCOUNTER — Ambulatory Visit: Payer: Medicare HMO | Admitting: Orthopedic Surgery

## 2018-11-26 DIAGNOSIS — M5416 Radiculopathy, lumbar region: Secondary | ICD-10-CM | POA: Diagnosis not present

## 2018-11-26 DIAGNOSIS — G8929 Other chronic pain: Secondary | ICD-10-CM | POA: Diagnosis not present

## 2018-11-26 DIAGNOSIS — M79606 Pain in leg, unspecified: Secondary | ICD-10-CM | POA: Diagnosis not present

## 2018-11-26 DIAGNOSIS — M79673 Pain in unspecified foot: Secondary | ICD-10-CM | POA: Diagnosis not present

## 2018-11-26 DIAGNOSIS — M25569 Pain in unspecified knee: Secondary | ICD-10-CM | POA: Diagnosis not present

## 2018-11-26 DIAGNOSIS — M545 Low back pain: Secondary | ICD-10-CM | POA: Diagnosis not present

## 2019-01-12 ENCOUNTER — Other Ambulatory Visit (HOSPITAL_COMMUNITY): Payer: Self-pay | Admitting: Neurology

## 2019-01-12 ENCOUNTER — Other Ambulatory Visit: Payer: Self-pay | Admitting: Neurology

## 2019-01-12 DIAGNOSIS — M5416 Radiculopathy, lumbar region: Secondary | ICD-10-CM | POA: Diagnosis not present

## 2019-01-12 DIAGNOSIS — M25562 Pain in left knee: Secondary | ICD-10-CM

## 2019-01-12 DIAGNOSIS — M79606 Pain in leg, unspecified: Secondary | ICD-10-CM | POA: Diagnosis not present

## 2019-01-12 DIAGNOSIS — M545 Low back pain, unspecified: Secondary | ICD-10-CM

## 2019-01-12 DIAGNOSIS — M79673 Pain in unspecified foot: Secondary | ICD-10-CM | POA: Diagnosis not present

## 2019-01-12 DIAGNOSIS — M25569 Pain in unspecified knee: Secondary | ICD-10-CM | POA: Diagnosis not present

## 2019-01-12 DIAGNOSIS — G8929 Other chronic pain: Secondary | ICD-10-CM | POA: Diagnosis not present

## 2019-01-21 ENCOUNTER — Ambulatory Visit (HOSPITAL_COMMUNITY): Payer: Medicare HMO

## 2019-01-21 ENCOUNTER — Encounter (HOSPITAL_COMMUNITY): Payer: Self-pay

## 2019-03-08 DIAGNOSIS — M170 Bilateral primary osteoarthritis of knee: Secondary | ICD-10-CM | POA: Diagnosis not present

## 2019-03-08 DIAGNOSIS — I10 Essential (primary) hypertension: Secondary | ICD-10-CM | POA: Diagnosis not present

## 2019-04-14 DIAGNOSIS — M545 Low back pain: Secondary | ICD-10-CM | POA: Diagnosis not present

## 2019-04-14 DIAGNOSIS — M79605 Pain in left leg: Secondary | ICD-10-CM | POA: Diagnosis not present

## 2019-04-14 DIAGNOSIS — M79606 Pain in leg, unspecified: Secondary | ICD-10-CM | POA: Diagnosis not present

## 2019-04-14 DIAGNOSIS — Z79891 Long term (current) use of opiate analgesic: Secondary | ICD-10-CM | POA: Diagnosis not present

## 2019-04-14 DIAGNOSIS — M25569 Pain in unspecified knee: Secondary | ICD-10-CM | POA: Diagnosis not present

## 2019-04-14 DIAGNOSIS — M79673 Pain in unspecified foot: Secondary | ICD-10-CM | POA: Diagnosis not present

## 2019-04-14 DIAGNOSIS — M5416 Radiculopathy, lumbar region: Secondary | ICD-10-CM | POA: Diagnosis not present

## 2019-04-14 DIAGNOSIS — G8929 Other chronic pain: Secondary | ICD-10-CM | POA: Diagnosis not present

## 2019-06-08 DIAGNOSIS — M170 Bilateral primary osteoarthritis of knee: Secondary | ICD-10-CM | POA: Diagnosis not present

## 2019-06-08 DIAGNOSIS — I10 Essential (primary) hypertension: Secondary | ICD-10-CM | POA: Diagnosis not present

## 2019-06-12 DIAGNOSIS — M5416 Radiculopathy, lumbar region: Secondary | ICD-10-CM | POA: Diagnosis not present

## 2019-06-12 DIAGNOSIS — M79673 Pain in unspecified foot: Secondary | ICD-10-CM | POA: Diagnosis not present

## 2019-06-12 DIAGNOSIS — Z79891 Long term (current) use of opiate analgesic: Secondary | ICD-10-CM | POA: Diagnosis not present

## 2019-06-12 DIAGNOSIS — M79605 Pain in left leg: Secondary | ICD-10-CM | POA: Diagnosis not present

## 2019-06-12 DIAGNOSIS — M25569 Pain in unspecified knee: Secondary | ICD-10-CM | POA: Diagnosis not present

## 2019-06-12 DIAGNOSIS — M545 Low back pain: Secondary | ICD-10-CM | POA: Diagnosis not present

## 2019-08-07 DIAGNOSIS — Z79891 Long term (current) use of opiate analgesic: Secondary | ICD-10-CM | POA: Diagnosis not present

## 2019-08-07 DIAGNOSIS — M549 Dorsalgia, unspecified: Secondary | ICD-10-CM | POA: Diagnosis not present

## 2019-08-07 DIAGNOSIS — M5416 Radiculopathy, lumbar region: Secondary | ICD-10-CM | POA: Diagnosis not present

## 2019-08-07 DIAGNOSIS — M79673 Pain in unspecified foot: Secondary | ICD-10-CM | POA: Diagnosis not present

## 2019-08-07 DIAGNOSIS — M545 Low back pain: Secondary | ICD-10-CM | POA: Diagnosis not present

## 2019-08-07 DIAGNOSIS — M25569 Pain in unspecified knee: Secondary | ICD-10-CM | POA: Diagnosis not present

## 2019-08-07 DIAGNOSIS — M79605 Pain in left leg: Secondary | ICD-10-CM | POA: Diagnosis not present

## 2019-09-08 DIAGNOSIS — M170 Bilateral primary osteoarthritis of knee: Secondary | ICD-10-CM | POA: Diagnosis not present

## 2019-09-08 DIAGNOSIS — I10 Essential (primary) hypertension: Secondary | ICD-10-CM | POA: Diagnosis not present

## 2019-10-29 DIAGNOSIS — M5416 Radiculopathy, lumbar region: Secondary | ICD-10-CM | POA: Diagnosis not present

## 2019-10-29 DIAGNOSIS — Z79891 Long term (current) use of opiate analgesic: Secondary | ICD-10-CM | POA: Diagnosis not present

## 2019-10-29 DIAGNOSIS — M545 Low back pain, unspecified: Secondary | ICD-10-CM | POA: Diagnosis not present

## 2019-10-29 DIAGNOSIS — M79605 Pain in left leg: Secondary | ICD-10-CM | POA: Diagnosis not present

## 2019-10-29 DIAGNOSIS — M79673 Pain in unspecified foot: Secondary | ICD-10-CM | POA: Diagnosis not present

## 2019-10-29 DIAGNOSIS — M25569 Pain in unspecified knee: Secondary | ICD-10-CM | POA: Diagnosis not present

## 2019-10-29 DIAGNOSIS — M549 Dorsalgia, unspecified: Secondary | ICD-10-CM | POA: Diagnosis not present

## 2019-11-06 DIAGNOSIS — I10 Essential (primary) hypertension: Secondary | ICD-10-CM | POA: Diagnosis not present

## 2019-11-06 DIAGNOSIS — M170 Bilateral primary osteoarthritis of knee: Secondary | ICD-10-CM | POA: Diagnosis not present

## 2019-11-16 DIAGNOSIS — Z6841 Body Mass Index (BMI) 40.0 and over, adult: Secondary | ICD-10-CM | POA: Diagnosis not present

## 2019-11-16 DIAGNOSIS — L989 Disorder of the skin and subcutaneous tissue, unspecified: Secondary | ICD-10-CM | POA: Diagnosis not present

## 2019-11-16 DIAGNOSIS — I10 Essential (primary) hypertension: Secondary | ICD-10-CM | POA: Diagnosis not present

## 2019-11-16 DIAGNOSIS — Z299 Encounter for prophylactic measures, unspecified: Secondary | ICD-10-CM | POA: Diagnosis not present

## 2019-11-16 DIAGNOSIS — M1712 Unilateral primary osteoarthritis, left knee: Secondary | ICD-10-CM | POA: Diagnosis not present

## 2019-11-16 DIAGNOSIS — Z789 Other specified health status: Secondary | ICD-10-CM | POA: Diagnosis not present

## 2019-12-08 DIAGNOSIS — M170 Bilateral primary osteoarthritis of knee: Secondary | ICD-10-CM | POA: Diagnosis not present

## 2019-12-08 DIAGNOSIS — I10 Essential (primary) hypertension: Secondary | ICD-10-CM | POA: Diagnosis not present

## 2020-01-21 DIAGNOSIS — M79673 Pain in unspecified foot: Secondary | ICD-10-CM | POA: Diagnosis not present

## 2020-01-21 DIAGNOSIS — M5416 Radiculopathy, lumbar region: Secondary | ICD-10-CM | POA: Diagnosis not present

## 2020-01-21 DIAGNOSIS — M25569 Pain in unspecified knee: Secondary | ICD-10-CM | POA: Diagnosis not present

## 2020-01-21 DIAGNOSIS — Z79891 Long term (current) use of opiate analgesic: Secondary | ICD-10-CM | POA: Diagnosis not present

## 2020-01-21 DIAGNOSIS — M549 Dorsalgia, unspecified: Secondary | ICD-10-CM | POA: Diagnosis not present

## 2020-01-21 DIAGNOSIS — M79605 Pain in left leg: Secondary | ICD-10-CM | POA: Diagnosis not present

## 2020-01-21 DIAGNOSIS — M545 Low back pain, unspecified: Secondary | ICD-10-CM | POA: Diagnosis not present

## 2020-03-28 ENCOUNTER — Other Ambulatory Visit (HOSPITAL_COMMUNITY): Payer: Self-pay | Admitting: Neurology

## 2020-03-28 DIAGNOSIS — Z1231 Encounter for screening mammogram for malignant neoplasm of breast: Secondary | ICD-10-CM

## 2020-04-06 ENCOUNTER — Ambulatory Visit (HOSPITAL_COMMUNITY): Payer: Medicare HMO

## 2020-04-06 DIAGNOSIS — I10 Essential (primary) hypertension: Secondary | ICD-10-CM | POA: Diagnosis not present

## 2020-04-06 DIAGNOSIS — M170 Bilateral primary osteoarthritis of knee: Secondary | ICD-10-CM | POA: Diagnosis not present

## 2020-04-14 DIAGNOSIS — M79673 Pain in unspecified foot: Secondary | ICD-10-CM | POA: Diagnosis not present

## 2020-04-14 DIAGNOSIS — Z79891 Long term (current) use of opiate analgesic: Secondary | ICD-10-CM | POA: Diagnosis not present

## 2020-04-14 DIAGNOSIS — M545 Low back pain, unspecified: Secondary | ICD-10-CM | POA: Diagnosis not present

## 2020-04-14 DIAGNOSIS — M79605 Pain in left leg: Secondary | ICD-10-CM | POA: Diagnosis not present

## 2020-04-14 DIAGNOSIS — M549 Dorsalgia, unspecified: Secondary | ICD-10-CM | POA: Diagnosis not present

## 2020-04-14 DIAGNOSIS — M5416 Radiculopathy, lumbar region: Secondary | ICD-10-CM | POA: Diagnosis not present

## 2020-04-14 DIAGNOSIS — M25569 Pain in unspecified knee: Secondary | ICD-10-CM | POA: Diagnosis not present

## 2020-05-06 DIAGNOSIS — M5416 Radiculopathy, lumbar region: Secondary | ICD-10-CM | POA: Diagnosis not present

## 2020-05-06 DIAGNOSIS — M5459 Other low back pain: Secondary | ICD-10-CM | POA: Diagnosis not present

## 2020-06-06 DIAGNOSIS — I5032 Chronic diastolic (congestive) heart failure: Secondary | ICD-10-CM | POA: Diagnosis not present

## 2020-06-06 DIAGNOSIS — E7800 Pure hypercholesterolemia, unspecified: Secondary | ICD-10-CM | POA: Diagnosis not present

## 2020-06-06 DIAGNOSIS — I10 Essential (primary) hypertension: Secondary | ICD-10-CM | POA: Diagnosis not present

## 2020-06-24 DIAGNOSIS — Z1331 Encounter for screening for depression: Secondary | ICD-10-CM | POA: Diagnosis not present

## 2020-06-24 DIAGNOSIS — R5383 Other fatigue: Secondary | ICD-10-CM | POA: Diagnosis not present

## 2020-06-24 DIAGNOSIS — Z79899 Other long term (current) drug therapy: Secondary | ICD-10-CM | POA: Diagnosis not present

## 2020-06-24 DIAGNOSIS — Z789 Other specified health status: Secondary | ICD-10-CM | POA: Diagnosis not present

## 2020-06-24 DIAGNOSIS — E7800 Pure hypercholesterolemia, unspecified: Secondary | ICD-10-CM | POA: Diagnosis not present

## 2020-06-24 DIAGNOSIS — Z6841 Body Mass Index (BMI) 40.0 and over, adult: Secondary | ICD-10-CM | POA: Diagnosis not present

## 2020-06-24 DIAGNOSIS — Z1339 Encounter for screening examination for other mental health and behavioral disorders: Secondary | ICD-10-CM | POA: Diagnosis not present

## 2020-06-24 DIAGNOSIS — Z299 Encounter for prophylactic measures, unspecified: Secondary | ICD-10-CM | POA: Diagnosis not present

## 2020-06-24 DIAGNOSIS — Z0000 Encounter for general adult medical examination without abnormal findings: Secondary | ICD-10-CM | POA: Diagnosis not present

## 2020-06-24 DIAGNOSIS — I10 Essential (primary) hypertension: Secondary | ICD-10-CM | POA: Diagnosis not present

## 2020-07-07 DIAGNOSIS — I10 Essential (primary) hypertension: Secondary | ICD-10-CM | POA: Diagnosis not present

## 2020-07-07 DIAGNOSIS — I5032 Chronic diastolic (congestive) heart failure: Secondary | ICD-10-CM | POA: Diagnosis not present

## 2020-07-07 DIAGNOSIS — E7800 Pure hypercholesterolemia, unspecified: Secondary | ICD-10-CM | POA: Diagnosis not present

## 2020-07-15 DIAGNOSIS — M79673 Pain in unspecified foot: Secondary | ICD-10-CM | POA: Diagnosis not present

## 2020-07-15 DIAGNOSIS — L989 Disorder of the skin and subcutaneous tissue, unspecified: Secondary | ICD-10-CM | POA: Diagnosis not present

## 2020-07-15 DIAGNOSIS — M25569 Pain in unspecified knee: Secondary | ICD-10-CM | POA: Diagnosis not present

## 2020-07-15 DIAGNOSIS — M549 Dorsalgia, unspecified: Secondary | ICD-10-CM | POA: Diagnosis not present

## 2020-07-15 DIAGNOSIS — M545 Low back pain, unspecified: Secondary | ICD-10-CM | POA: Diagnosis not present

## 2020-07-15 DIAGNOSIS — Z79891 Long term (current) use of opiate analgesic: Secondary | ICD-10-CM | POA: Diagnosis not present

## 2020-07-15 DIAGNOSIS — M5416 Radiculopathy, lumbar region: Secondary | ICD-10-CM | POA: Diagnosis not present

## 2020-07-15 DIAGNOSIS — M79605 Pain in left leg: Secondary | ICD-10-CM | POA: Diagnosis not present

## 2020-10-06 DIAGNOSIS — M79673 Pain in unspecified foot: Secondary | ICD-10-CM | POA: Diagnosis not present

## 2020-10-06 DIAGNOSIS — M5416 Radiculopathy, lumbar region: Secondary | ICD-10-CM | POA: Diagnosis not present

## 2020-10-06 DIAGNOSIS — M545 Low back pain, unspecified: Secondary | ICD-10-CM | POA: Diagnosis not present

## 2020-10-06 DIAGNOSIS — Z79891 Long term (current) use of opiate analgesic: Secondary | ICD-10-CM | POA: Diagnosis not present

## 2020-10-06 DIAGNOSIS — M25569 Pain in unspecified knee: Secondary | ICD-10-CM | POA: Diagnosis not present

## 2020-10-06 DIAGNOSIS — M549 Dorsalgia, unspecified: Secondary | ICD-10-CM | POA: Diagnosis not present

## 2020-10-06 DIAGNOSIS — M79605 Pain in left leg: Secondary | ICD-10-CM | POA: Diagnosis not present

## 2020-10-06 DIAGNOSIS — L989 Disorder of the skin and subcutaneous tissue, unspecified: Secondary | ICD-10-CM | POA: Diagnosis not present

## 2020-10-07 DIAGNOSIS — I10 Essential (primary) hypertension: Secondary | ICD-10-CM | POA: Diagnosis not present

## 2020-10-07 DIAGNOSIS — L720 Epidermal cyst: Secondary | ICD-10-CM | POA: Diagnosis not present

## 2020-12-26 DIAGNOSIS — M79605 Pain in left leg: Secondary | ICD-10-CM | POA: Diagnosis not present

## 2020-12-26 DIAGNOSIS — M25569 Pain in unspecified knee: Secondary | ICD-10-CM | POA: Diagnosis not present

## 2020-12-26 DIAGNOSIS — M545 Low back pain, unspecified: Secondary | ICD-10-CM | POA: Diagnosis not present

## 2020-12-26 DIAGNOSIS — Z79891 Long term (current) use of opiate analgesic: Secondary | ICD-10-CM | POA: Diagnosis not present

## 2020-12-26 DIAGNOSIS — M79673 Pain in unspecified foot: Secondary | ICD-10-CM | POA: Diagnosis not present

## 2020-12-26 DIAGNOSIS — M5416 Radiculopathy, lumbar region: Secondary | ICD-10-CM | POA: Diagnosis not present

## 2020-12-26 DIAGNOSIS — L989 Disorder of the skin and subcutaneous tissue, unspecified: Secondary | ICD-10-CM | POA: Diagnosis not present

## 2020-12-26 DIAGNOSIS — M549 Dorsalgia, unspecified: Secondary | ICD-10-CM | POA: Diagnosis not present

## 2021-01-06 DIAGNOSIS — I10 Essential (primary) hypertension: Secondary | ICD-10-CM | POA: Diagnosis not present

## 2021-01-06 DIAGNOSIS — L720 Epidermal cyst: Secondary | ICD-10-CM | POA: Diagnosis not present

## 2021-03-20 DIAGNOSIS — M79673 Pain in unspecified foot: Secondary | ICD-10-CM | POA: Diagnosis not present

## 2021-03-20 DIAGNOSIS — Z79891 Long term (current) use of opiate analgesic: Secondary | ICD-10-CM | POA: Diagnosis not present

## 2021-03-20 DIAGNOSIS — M25569 Pain in unspecified knee: Secondary | ICD-10-CM | POA: Diagnosis not present

## 2021-03-20 DIAGNOSIS — M545 Low back pain, unspecified: Secondary | ICD-10-CM | POA: Diagnosis not present

## 2021-03-20 DIAGNOSIS — G8929 Other chronic pain: Secondary | ICD-10-CM | POA: Diagnosis not present

## 2021-03-20 DIAGNOSIS — M79606 Pain in leg, unspecified: Secondary | ICD-10-CM | POA: Diagnosis not present

## 2021-03-20 DIAGNOSIS — M5416 Radiculopathy, lumbar region: Secondary | ICD-10-CM | POA: Diagnosis not present

## 2021-04-06 DIAGNOSIS — I10 Essential (primary) hypertension: Secondary | ICD-10-CM | POA: Diagnosis not present

## 2021-04-06 DIAGNOSIS — E785 Hyperlipidemia, unspecified: Secondary | ICD-10-CM | POA: Diagnosis not present

## 2021-06-07 DIAGNOSIS — I10 Essential (primary) hypertension: Secondary | ICD-10-CM | POA: Diagnosis not present

## 2021-06-07 DIAGNOSIS — E7800 Pure hypercholesterolemia, unspecified: Secondary | ICD-10-CM | POA: Diagnosis not present

## 2021-06-07 DIAGNOSIS — I5032 Chronic diastolic (congestive) heart failure: Secondary | ICD-10-CM | POA: Diagnosis not present

## 2021-06-12 DIAGNOSIS — M79606 Pain in leg, unspecified: Secondary | ICD-10-CM | POA: Diagnosis not present

## 2021-06-12 DIAGNOSIS — M545 Low back pain, unspecified: Secondary | ICD-10-CM | POA: Diagnosis not present

## 2021-06-12 DIAGNOSIS — G8929 Other chronic pain: Secondary | ICD-10-CM | POA: Diagnosis not present

## 2021-06-12 DIAGNOSIS — M25569 Pain in unspecified knee: Secondary | ICD-10-CM | POA: Diagnosis not present

## 2021-06-12 DIAGNOSIS — M5416 Radiculopathy, lumbar region: Secondary | ICD-10-CM | POA: Diagnosis not present

## 2021-06-12 DIAGNOSIS — M79673 Pain in unspecified foot: Secondary | ICD-10-CM | POA: Diagnosis not present

## 2021-06-12 DIAGNOSIS — Z79891 Long term (current) use of opiate analgesic: Secondary | ICD-10-CM | POA: Diagnosis not present

## 2021-06-26 ENCOUNTER — Other Ambulatory Visit (HOSPITAL_COMMUNITY): Payer: Self-pay | Admitting: Internal Medicine

## 2021-06-26 DIAGNOSIS — Z1231 Encounter for screening mammogram for malignant neoplasm of breast: Secondary | ICD-10-CM

## 2021-07-03 ENCOUNTER — Ambulatory Visit (HOSPITAL_COMMUNITY): Payer: Medicare HMO

## 2021-07-28 IMAGING — DX RIGHT KNEE - 1-2 VIEW
2 series · 2 of 2 positions shown · non-contrast
Comparison: None.

CLINICAL DATA: 63-year-old female with chronic bilateral knee pain.

EXAM:
RIGHT KNEE - 1-2 VIEW

[knee ap]
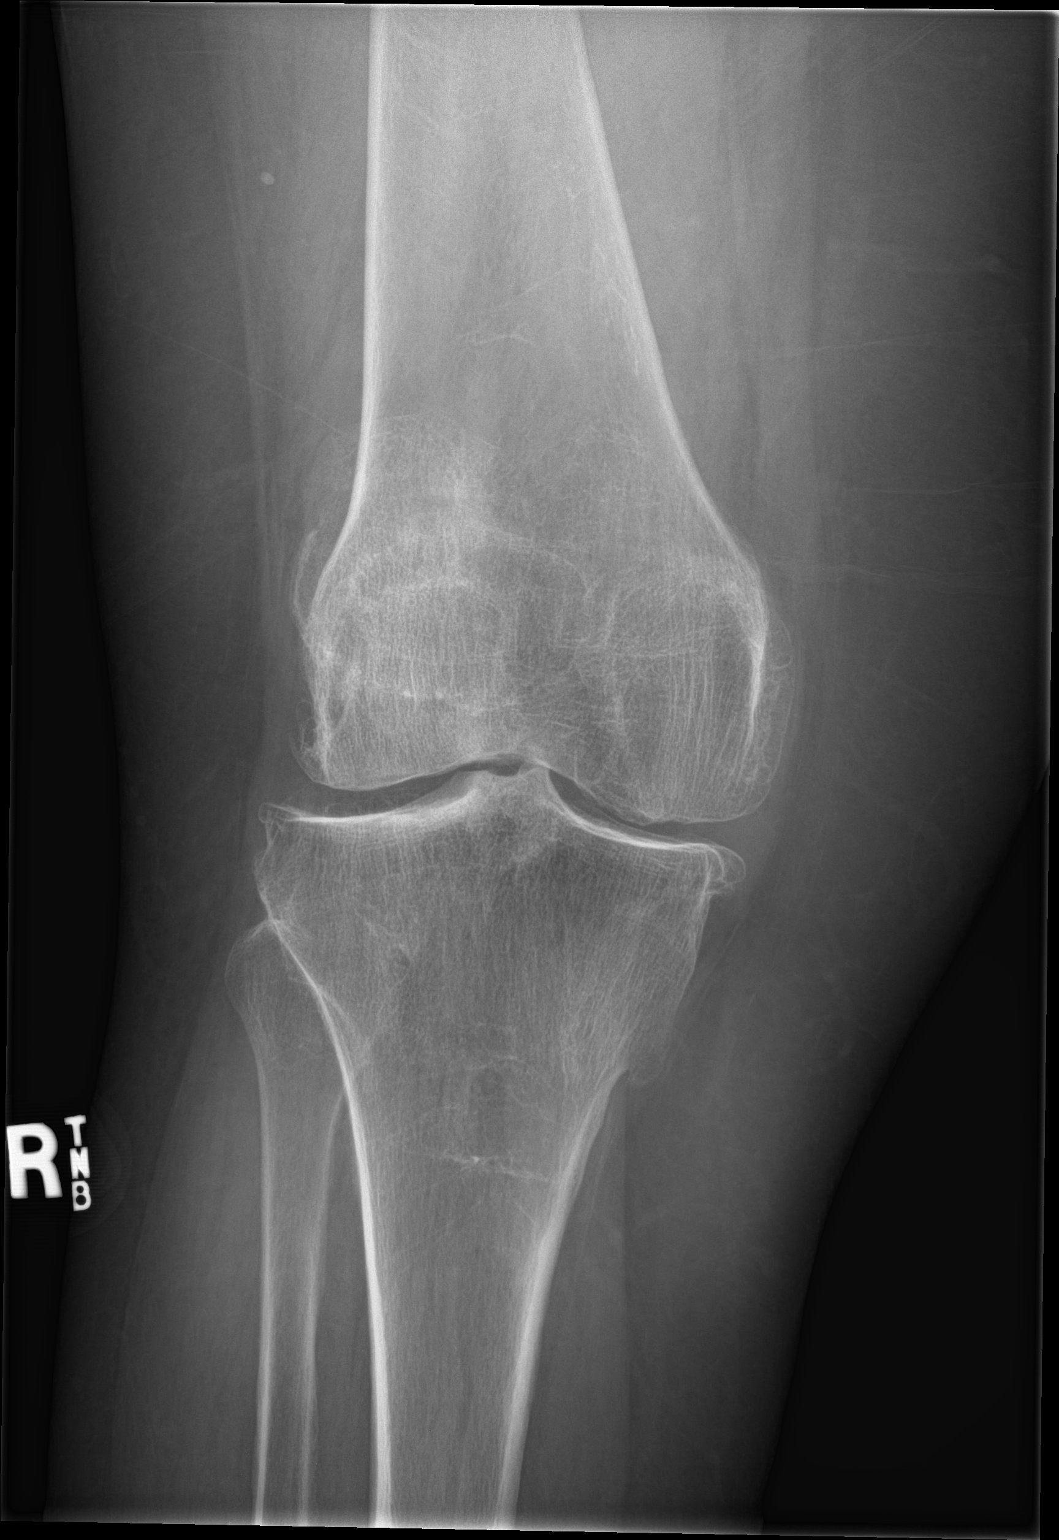

[knee lat]
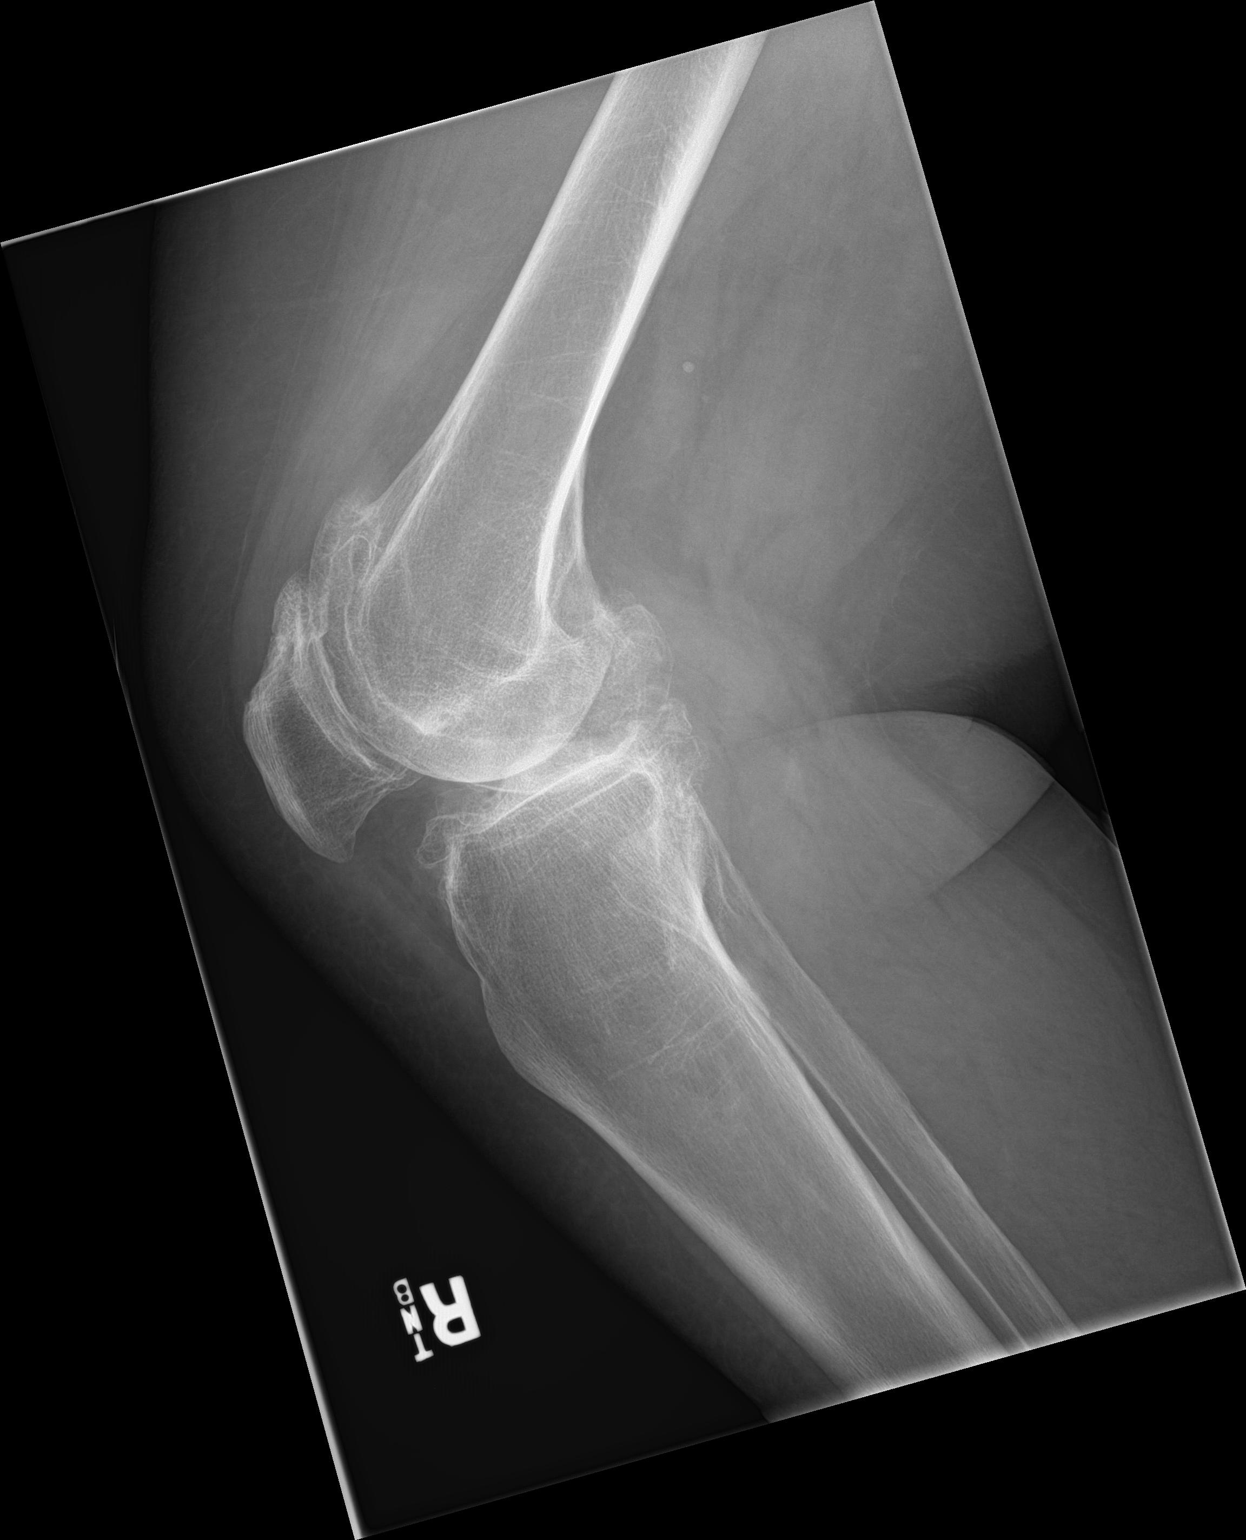

[2 of 2 positions shown; findings below may reference images not displayed]

FINDINGS: Bulky tricompartmental degenerative spurring, and at least moderate
tricompartmental joint space loss. Underlying bone mineralization is
within normal limits. There appears to be a small osteochondroma of
the medial proximal right tibia metadiaphysis (benign). No joint
effusion is evident. No acute osseous abnormality identified.
IMPRESSION: 1. Moderate to severe tricompartmental degenerative changes. No
acute osseous abnormality identified.
2. Small benign osteochondroma of the proximal tibia suspected.

## 2021-07-28 IMAGING — DX LEFT KNEE - 1-2 VIEW
2 series · 2 of 2 positions shown · non-contrast
Comparison: contralateral right knee today reported separately

CLINICAL DATA: 63-year-old female with chronic bilateral knee pain.

EXAM:
LEFT KNEE - 1-2 VIEW

[knee ap]
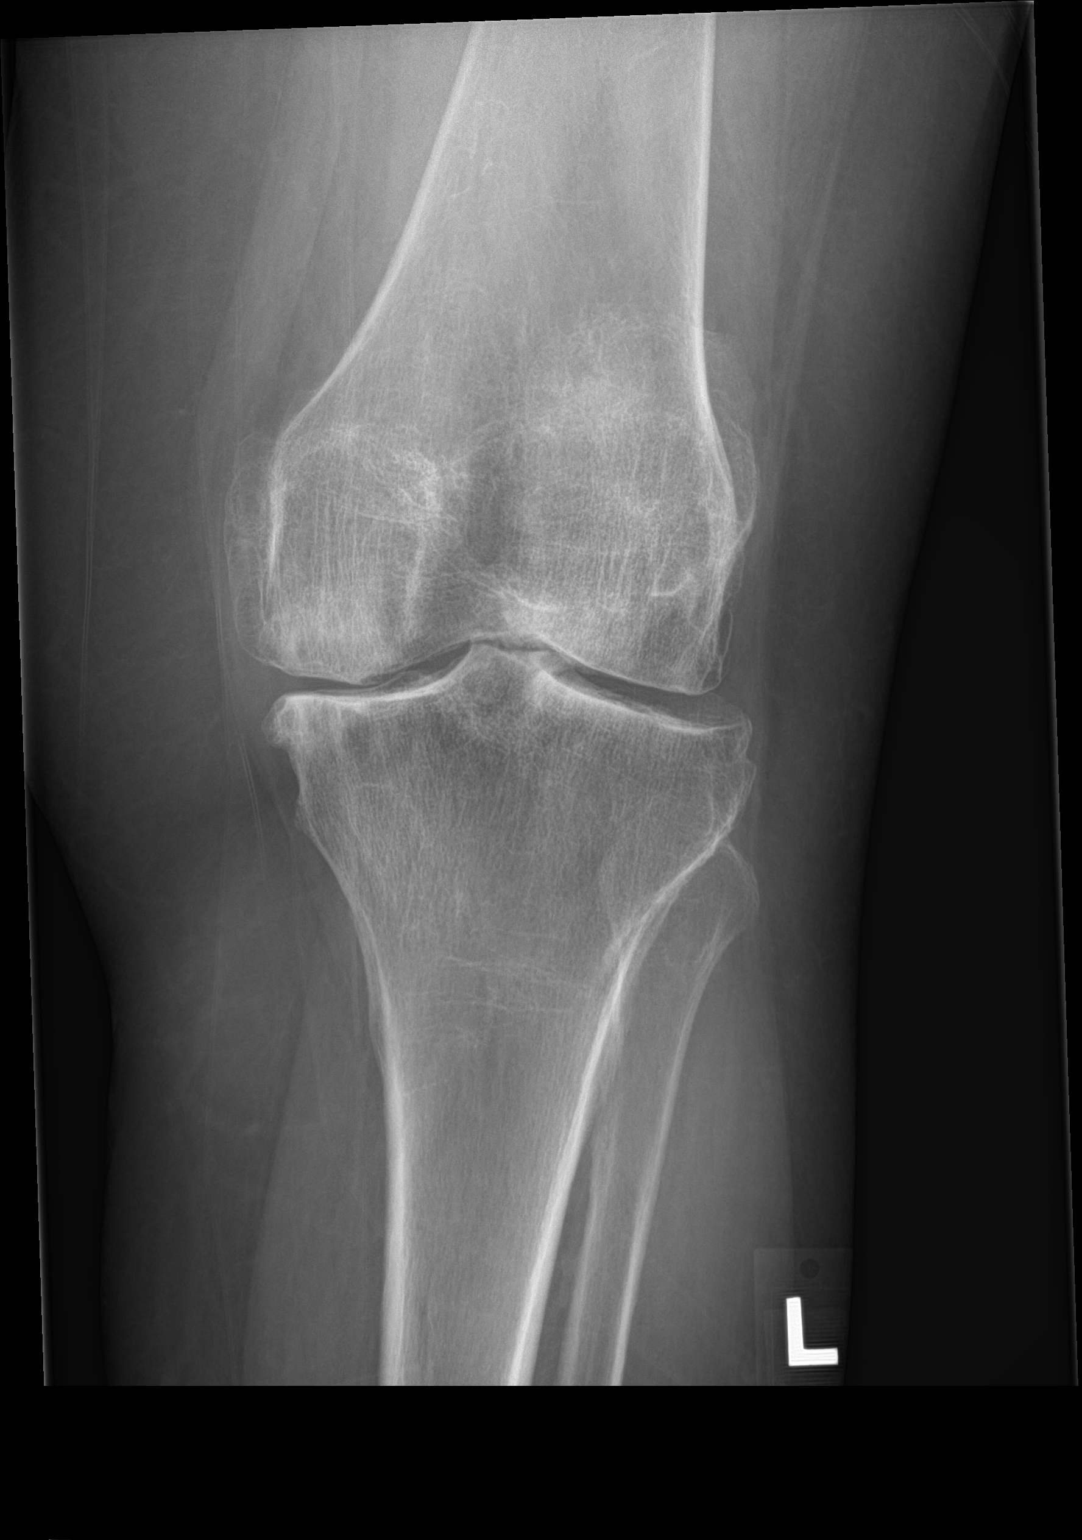

[knee lat]
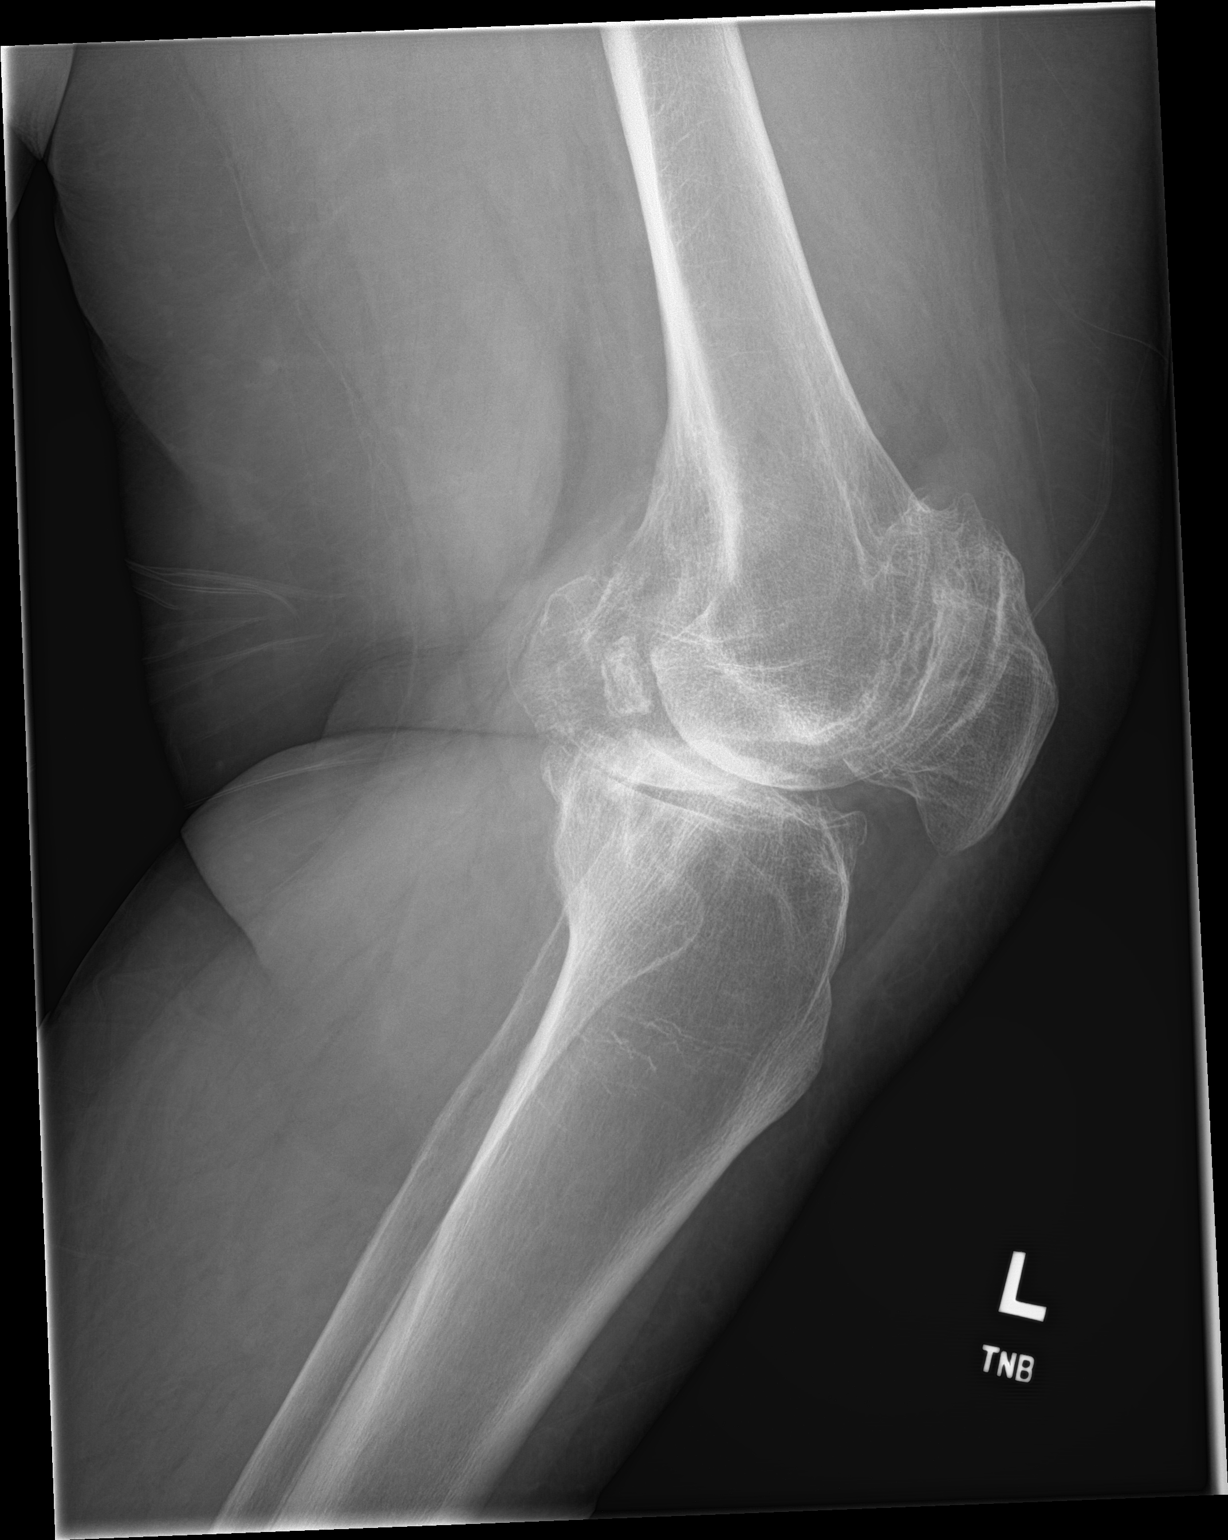

[2 of 2 positions shown; findings below may reference images not displayed]

FINDINGS: Bulky tricompartmental degenerative spurring and moderate to severe
joint space loss. Lateral greater than medial compartment joint
space loss. No definite joint effusion. No acute osseous abnormality
identified.
IMPRESSION: Moderate to severe tricompartmental degenerative changes with no
acute osseous abnormality identified.

## 2021-08-16 ENCOUNTER — Inpatient Hospital Stay: Admission: RE | Admit: 2021-08-16 | Payer: Medicare HMO | Source: Ambulatory Visit

## 2021-08-21 ENCOUNTER — Ambulatory Visit
Admission: RE | Admit: 2021-08-21 | Discharge: 2021-08-21 | Disposition: A | Discharge status: Home / Self Care | Payer: Medicare HMO | Source: Ambulatory Visit | Attending: Internal Medicine | Admitting: Internal Medicine

## 2021-08-21 DIAGNOSIS — Z1231 Encounter for screening mammogram for malignant neoplasm of breast: Secondary | ICD-10-CM

## 2021-09-04 DIAGNOSIS — M545 Low back pain, unspecified: Secondary | ICD-10-CM | POA: Diagnosis not present

## 2021-09-04 DIAGNOSIS — G8929 Other chronic pain: Secondary | ICD-10-CM | POA: Diagnosis not present

## 2021-09-04 DIAGNOSIS — M5416 Radiculopathy, lumbar region: Secondary | ICD-10-CM | POA: Diagnosis not present

## 2021-09-04 DIAGNOSIS — M25569 Pain in unspecified knee: Secondary | ICD-10-CM | POA: Diagnosis not present

## 2021-09-04 DIAGNOSIS — M79606 Pain in leg, unspecified: Secondary | ICD-10-CM | POA: Diagnosis not present

## 2021-09-04 DIAGNOSIS — Z79891 Long term (current) use of opiate analgesic: Secondary | ICD-10-CM | POA: Diagnosis not present

## 2021-09-04 DIAGNOSIS — M79673 Pain in unspecified foot: Secondary | ICD-10-CM | POA: Diagnosis not present

## 2021-10-26 DIAGNOSIS — I10 Essential (primary) hypertension: Secondary | ICD-10-CM | POA: Diagnosis not present

## 2021-10-26 DIAGNOSIS — Z23 Encounter for immunization: Secondary | ICD-10-CM | POA: Diagnosis not present

## 2021-10-26 DIAGNOSIS — Z6841 Body Mass Index (BMI) 40.0 and over, adult: Secondary | ICD-10-CM | POA: Diagnosis not present

## 2021-10-26 DIAGNOSIS — Z7189 Other specified counseling: Secondary | ICD-10-CM | POA: Diagnosis not present

## 2021-10-26 DIAGNOSIS — Z1331 Encounter for screening for depression: Secondary | ICD-10-CM | POA: Diagnosis not present

## 2021-10-26 DIAGNOSIS — Z79899 Other long term (current) drug therapy: Secondary | ICD-10-CM | POA: Diagnosis not present

## 2021-10-26 DIAGNOSIS — Z789 Other specified health status: Secondary | ICD-10-CM | POA: Diagnosis not present

## 2021-10-26 DIAGNOSIS — E7800 Pure hypercholesterolemia, unspecified: Secondary | ICD-10-CM | POA: Diagnosis not present

## 2021-10-26 DIAGNOSIS — Z299 Encounter for prophylactic measures, unspecified: Secondary | ICD-10-CM | POA: Diagnosis not present

## 2021-10-26 DIAGNOSIS — Z0000 Encounter for general adult medical examination without abnormal findings: Secondary | ICD-10-CM | POA: Diagnosis not present

## 2021-10-26 DIAGNOSIS — R5383 Other fatigue: Secondary | ICD-10-CM | POA: Diagnosis not present

## 2021-11-27 DIAGNOSIS — Z79891 Long term (current) use of opiate analgesic: Secondary | ICD-10-CM | POA: Diagnosis not present

## 2021-11-27 DIAGNOSIS — M5416 Radiculopathy, lumbar region: Secondary | ICD-10-CM | POA: Diagnosis not present

## 2021-11-27 DIAGNOSIS — M545 Low back pain, unspecified: Secondary | ICD-10-CM | POA: Diagnosis not present

## 2021-11-27 DIAGNOSIS — M79606 Pain in leg, unspecified: Secondary | ICD-10-CM | POA: Diagnosis not present

## 2021-11-27 DIAGNOSIS — G8929 Other chronic pain: Secondary | ICD-10-CM | POA: Diagnosis not present

## 2021-11-27 DIAGNOSIS — M79673 Pain in unspecified foot: Secondary | ICD-10-CM | POA: Diagnosis not present

## 2021-11-27 DIAGNOSIS — M25569 Pain in unspecified knee: Secondary | ICD-10-CM | POA: Diagnosis not present

## 2022-04-03 DIAGNOSIS — M5451 Vertebrogenic low back pain: Secondary | ICD-10-CM | POA: Diagnosis not present

## 2022-04-03 DIAGNOSIS — Z79899 Other long term (current) drug therapy: Secondary | ICD-10-CM | POA: Diagnosis not present

## 2022-04-03 DIAGNOSIS — Z79891 Long term (current) use of opiate analgesic: Secondary | ICD-10-CM | POA: Diagnosis not present

## 2022-04-03 DIAGNOSIS — G894 Chronic pain syndrome: Secondary | ICD-10-CM | POA: Diagnosis not present

## 2022-04-03 DIAGNOSIS — M5416 Radiculopathy, lumbar region: Secondary | ICD-10-CM | POA: Diagnosis not present

## 2022-04-03 DIAGNOSIS — M79673 Pain in unspecified foot: Secondary | ICD-10-CM | POA: Diagnosis not present

## 2022-05-07 DIAGNOSIS — Z0000 Encounter for general adult medical examination without abnormal findings: Secondary | ICD-10-CM | POA: Diagnosis not present

## 2022-05-07 DIAGNOSIS — R5383 Other fatigue: Secondary | ICD-10-CM | POA: Diagnosis not present

## 2022-05-07 DIAGNOSIS — M5416 Radiculopathy, lumbar region: Secondary | ICD-10-CM | POA: Diagnosis not present

## 2022-05-07 DIAGNOSIS — E7800 Pure hypercholesterolemia, unspecified: Secondary | ICD-10-CM | POA: Diagnosis not present

## 2022-05-07 DIAGNOSIS — Z79899 Other long term (current) drug therapy: Secondary | ICD-10-CM | POA: Diagnosis not present

## 2022-05-07 DIAGNOSIS — M79673 Pain in unspecified foot: Secondary | ICD-10-CM | POA: Diagnosis not present

## 2022-05-07 DIAGNOSIS — Z299 Encounter for prophylactic measures, unspecified: Secondary | ICD-10-CM | POA: Diagnosis not present

## 2022-05-07 DIAGNOSIS — G894 Chronic pain syndrome: Secondary | ICD-10-CM | POA: Diagnosis not present

## 2022-05-07 DIAGNOSIS — M25561 Pain in right knee: Secondary | ICD-10-CM | POA: Diagnosis not present

## 2022-05-07 DIAGNOSIS — I10 Essential (primary) hypertension: Secondary | ICD-10-CM | POA: Diagnosis not present

## 2022-06-06 DIAGNOSIS — Z79891 Long term (current) use of opiate analgesic: Secondary | ICD-10-CM | POA: Diagnosis not present

## 2022-06-06 DIAGNOSIS — Z79899 Other long term (current) drug therapy: Secondary | ICD-10-CM | POA: Diagnosis not present

## 2022-06-06 DIAGNOSIS — M5416 Radiculopathy, lumbar region: Secondary | ICD-10-CM | POA: Diagnosis not present

## 2022-06-06 DIAGNOSIS — M79673 Pain in unspecified foot: Secondary | ICD-10-CM | POA: Diagnosis not present

## 2022-06-06 DIAGNOSIS — G894 Chronic pain syndrome: Secondary | ICD-10-CM | POA: Diagnosis not present

## 2022-06-06 DIAGNOSIS — M25561 Pain in right knee: Secondary | ICD-10-CM | POA: Diagnosis not present

## 2022-06-06 DIAGNOSIS — M5451 Vertebrogenic low back pain: Secondary | ICD-10-CM | POA: Diagnosis not present

## 2022-09-25 DIAGNOSIS — M5451 Vertebrogenic low back pain: Secondary | ICD-10-CM | POA: Diagnosis not present

## 2022-09-25 DIAGNOSIS — G894 Chronic pain syndrome: Secondary | ICD-10-CM | POA: Diagnosis not present

## 2022-09-25 DIAGNOSIS — M79673 Pain in unspecified foot: Secondary | ICD-10-CM | POA: Diagnosis not present

## 2022-09-25 DIAGNOSIS — M5416 Radiculopathy, lumbar region: Secondary | ICD-10-CM | POA: Diagnosis not present

## 2022-09-25 DIAGNOSIS — M25561 Pain in right knee: Secondary | ICD-10-CM | POA: Diagnosis not present

## 2022-10-23 DIAGNOSIS — M5416 Radiculopathy, lumbar region: Secondary | ICD-10-CM | POA: Diagnosis not present

## 2022-10-23 DIAGNOSIS — M5451 Vertebrogenic low back pain: Secondary | ICD-10-CM | POA: Diagnosis not present

## 2022-10-23 DIAGNOSIS — G894 Chronic pain syndrome: Secondary | ICD-10-CM | POA: Diagnosis not present

## 2022-10-23 DIAGNOSIS — M25561 Pain in right knee: Secondary | ICD-10-CM | POA: Diagnosis not present

## 2022-10-23 DIAGNOSIS — M79673 Pain in unspecified foot: Secondary | ICD-10-CM | POA: Diagnosis not present

## 2022-10-29 DIAGNOSIS — M5416 Radiculopathy, lumbar region: Secondary | ICD-10-CM | POA: Diagnosis not present

## 2022-10-29 DIAGNOSIS — M5451 Vertebrogenic low back pain: Secondary | ICD-10-CM | POA: Diagnosis not present

## 2022-10-29 DIAGNOSIS — M25561 Pain in right knee: Secondary | ICD-10-CM | POA: Diagnosis not present

## 2022-10-29 DIAGNOSIS — M79673 Pain in unspecified foot: Secondary | ICD-10-CM | POA: Diagnosis not present

## 2022-10-29 DIAGNOSIS — Z79891 Long term (current) use of opiate analgesic: Secondary | ICD-10-CM | POA: Diagnosis not present

## 2022-10-29 DIAGNOSIS — G894 Chronic pain syndrome: Secondary | ICD-10-CM | POA: Diagnosis not present

## 2022-11-09 DIAGNOSIS — M1712 Unilateral primary osteoarthritis, left knee: Secondary | ICD-10-CM | POA: Diagnosis not present

## 2022-11-09 DIAGNOSIS — I10 Essential (primary) hypertension: Secondary | ICD-10-CM | POA: Diagnosis not present

## 2022-11-09 DIAGNOSIS — Z6841 Body Mass Index (BMI) 40.0 and over, adult: Secondary | ICD-10-CM | POA: Diagnosis not present

## 2022-11-09 DIAGNOSIS — Z299 Encounter for prophylactic measures, unspecified: Secondary | ICD-10-CM | POA: Diagnosis not present

## 2022-11-26 DIAGNOSIS — Z79891 Long term (current) use of opiate analgesic: Secondary | ICD-10-CM | POA: Diagnosis not present

## 2022-11-26 DIAGNOSIS — M25561 Pain in right knee: Secondary | ICD-10-CM | POA: Diagnosis not present

## 2022-11-26 DIAGNOSIS — M79673 Pain in unspecified foot: Secondary | ICD-10-CM | POA: Diagnosis not present

## 2022-11-26 DIAGNOSIS — G894 Chronic pain syndrome: Secondary | ICD-10-CM | POA: Diagnosis not present

## 2022-11-26 DIAGNOSIS — M5451 Vertebrogenic low back pain: Secondary | ICD-10-CM | POA: Diagnosis not present

## 2022-11-26 DIAGNOSIS — M5416 Radiculopathy, lumbar region: Secondary | ICD-10-CM | POA: Diagnosis not present

## 2022-12-24 DIAGNOSIS — M79673 Pain in unspecified foot: Secondary | ICD-10-CM | POA: Diagnosis not present

## 2022-12-24 DIAGNOSIS — M25561 Pain in right knee: Secondary | ICD-10-CM | POA: Diagnosis not present

## 2022-12-24 DIAGNOSIS — M5416 Radiculopathy, lumbar region: Secondary | ICD-10-CM | POA: Diagnosis not present

## 2022-12-24 DIAGNOSIS — M5451 Vertebrogenic low back pain: Secondary | ICD-10-CM | POA: Diagnosis not present

## 2022-12-24 DIAGNOSIS — G894 Chronic pain syndrome: Secondary | ICD-10-CM | POA: Diagnosis not present

## 2023-01-30 DIAGNOSIS — E2839 Other primary ovarian failure: Secondary | ICD-10-CM | POA: Diagnosis not present

## 2023-01-30 DIAGNOSIS — Z7189 Other specified counseling: Secondary | ICD-10-CM | POA: Diagnosis not present

## 2023-01-30 DIAGNOSIS — I10 Essential (primary) hypertension: Secondary | ICD-10-CM | POA: Diagnosis not present

## 2023-01-30 DIAGNOSIS — Z1211 Encounter for screening for malignant neoplasm of colon: Secondary | ICD-10-CM | POA: Diagnosis not present

## 2023-01-30 DIAGNOSIS — Z0000 Encounter for general adult medical examination without abnormal findings: Secondary | ICD-10-CM | POA: Diagnosis not present

## 2023-01-30 DIAGNOSIS — Z1331 Encounter for screening for depression: Secondary | ICD-10-CM | POA: Diagnosis not present

## 2023-01-30 DIAGNOSIS — Z299 Encounter for prophylactic measures, unspecified: Secondary | ICD-10-CM | POA: Diagnosis not present

## 2023-01-30 DIAGNOSIS — Z1339 Encounter for screening examination for other mental health and behavioral disorders: Secondary | ICD-10-CM | POA: Diagnosis not present

## 2023-01-30 DIAGNOSIS — R52 Pain, unspecified: Secondary | ICD-10-CM | POA: Diagnosis not present

## 2023-02-19 DIAGNOSIS — Z79891 Long term (current) use of opiate analgesic: Secondary | ICD-10-CM | POA: Diagnosis not present

## 2023-02-19 DIAGNOSIS — M25561 Pain in right knee: Secondary | ICD-10-CM | POA: Diagnosis not present

## 2023-02-19 DIAGNOSIS — M25532 Pain in left wrist: Secondary | ICD-10-CM | POA: Diagnosis not present

## 2023-02-19 DIAGNOSIS — M5416 Radiculopathy, lumbar region: Secondary | ICD-10-CM | POA: Diagnosis not present

## 2023-02-19 DIAGNOSIS — M79673 Pain in unspecified foot: Secondary | ICD-10-CM | POA: Diagnosis not present

## 2023-02-19 DIAGNOSIS — G894 Chronic pain syndrome: Secondary | ICD-10-CM | POA: Diagnosis not present

## 2023-02-19 DIAGNOSIS — M5451 Vertebrogenic low back pain: Secondary | ICD-10-CM | POA: Diagnosis not present

## 2023-03-19 DIAGNOSIS — M79673 Pain in unspecified foot: Secondary | ICD-10-CM | POA: Diagnosis not present

## 2023-03-19 DIAGNOSIS — M25532 Pain in left wrist: Secondary | ICD-10-CM | POA: Diagnosis not present

## 2023-03-19 DIAGNOSIS — M5416 Radiculopathy, lumbar region: Secondary | ICD-10-CM | POA: Diagnosis not present

## 2023-03-19 DIAGNOSIS — M25561 Pain in right knee: Secondary | ICD-10-CM | POA: Diagnosis not present

## 2023-03-19 DIAGNOSIS — M5451 Vertebrogenic low back pain: Secondary | ICD-10-CM | POA: Diagnosis not present

## 2023-03-19 DIAGNOSIS — G894 Chronic pain syndrome: Secondary | ICD-10-CM | POA: Diagnosis not present

## 2023-05-14 DIAGNOSIS — M25532 Pain in left wrist: Secondary | ICD-10-CM | POA: Diagnosis not present

## 2023-05-14 DIAGNOSIS — Z79891 Long term (current) use of opiate analgesic: Secondary | ICD-10-CM | POA: Diagnosis not present

## 2023-05-14 DIAGNOSIS — M5416 Radiculopathy, lumbar region: Secondary | ICD-10-CM | POA: Diagnosis not present

## 2023-05-14 DIAGNOSIS — G894 Chronic pain syndrome: Secondary | ICD-10-CM | POA: Diagnosis not present

## 2023-05-14 DIAGNOSIS — M5451 Vertebrogenic low back pain: Secondary | ICD-10-CM | POA: Diagnosis not present

## 2023-05-14 DIAGNOSIS — M79673 Pain in unspecified foot: Secondary | ICD-10-CM | POA: Diagnosis not present

## 2023-05-14 DIAGNOSIS — M25561 Pain in right knee: Secondary | ICD-10-CM | POA: Diagnosis not present

## 2023-10-31 ENCOUNTER — Ambulatory Visit: Admitting: Orthopedic Surgery
